# Patient Record
Sex: Male | Born: 2000 | Race: White | Hispanic: No | Marital: Single | Smoking: Current every day smoker
Health system: Southern US, Community
[De-identification: ages and names within clinical notes are randomized; demographics above are authoritative.]

## PROBLEM LIST (undated history)

## (undated) DIAGNOSIS — H6690 Otitis media, unspecified, unspecified ear: Secondary | ICD-10-CM

## (undated) DIAGNOSIS — R109 Unspecified abdominal pain: Secondary | ICD-10-CM

## (undated) DIAGNOSIS — F419 Anxiety disorder, unspecified: Secondary | ICD-10-CM

## (undated) DIAGNOSIS — R519 Headache, unspecified: Secondary | ICD-10-CM

## (undated) DIAGNOSIS — F909 Attention-deficit hyperactivity disorder, unspecified type: Secondary | ICD-10-CM

## (undated) DIAGNOSIS — R197 Diarrhea, unspecified: Secondary | ICD-10-CM

## (undated) DIAGNOSIS — R51 Headache: Secondary | ICD-10-CM

## (undated) HISTORY — DX: Headache: R51

## (undated) HISTORY — DX: Headache, unspecified: R51.9

## (undated) HISTORY — DX: Anxiety disorder, unspecified: F41.9

## (undated) HISTORY — DX: Diarrhea, unspecified: R19.7

## (undated) HISTORY — DX: Unspecified abdominal pain: R10.9

## (undated) HISTORY — DX: Otitis media, unspecified, unspecified ear: H66.90

## (undated) HISTORY — DX: Attention-deficit hyperactivity disorder, unspecified type: F90.9

---
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
## 2018-01-02 DIAGNOSIS — Z79899 Other long term (current) drug therapy: Secondary | ICD-10-CM | POA: Diagnosis not present

## 2018-01-07 DIAGNOSIS — L237 Allergic contact dermatitis due to plants, except food: Secondary | ICD-10-CM | POA: Diagnosis not present

## 2018-01-10 DIAGNOSIS — L258 Unspecified contact dermatitis due to other agents: Secondary | ICD-10-CM | POA: Diagnosis not present

## 2018-01-14 DIAGNOSIS — R21 Rash and other nonspecific skin eruption: Secondary | ICD-10-CM | POA: Diagnosis not present

## 2018-01-16 ENCOUNTER — Encounter: Payer: Self-pay | Admitting: Physician Assistant

## 2018-01-16 ENCOUNTER — Ambulatory Visit (INDEPENDENT_AMBULATORY_CARE_PROVIDER_SITE_OTHER): Payer: 59 | Admitting: Physician Assistant

## 2018-01-16 ENCOUNTER — Other Ambulatory Visit: Payer: Self-pay

## 2018-01-16 VITALS — BP 122/78 | HR 91 | Temp 98.6°F | Resp 17 | Ht 73.5 in | Wt 146.8 lb

## 2018-01-16 DIAGNOSIS — Z0000 Encounter for general adult medical examination without abnormal findings: Secondary | ICD-10-CM | POA: Insufficient documentation

## 2018-01-16 DIAGNOSIS — F4010 Social phobia, unspecified: Secondary | ICD-10-CM | POA: Diagnosis not present

## 2018-01-16 NOTE — Assessment & Plan Note (Addendum)
Depression screen negative. Health Maintenance reviewed -- Pediatric Immunizations up-to-date. TDaP up-to-date. Due for booster in 2021. Declines flu shot today. Patient is sexually active with male partners, notes consistent use of condoms.  Declines routine STI screen. Will start routine lipid checks at age 17.  Dietary and exercise recommendations given today. Preventive schedule discussed and handout given in AVS.

## 2018-01-16 NOTE — Patient Instructions (Signed)
Please continue care for rash as directed by the Urgent Care. Symptoms should continue to resolve. We will follow-up yearly for physicals and when you need me for sick visits and acute concerns. Please follow-up with your specialist as scheduled.   Welcome to [facility]!   Preventive Care 18-39 Years, Male Preventive care refers to lifestyle choices and visits with your health care provider that can promote health and wellness. What does preventive care include?  A yearly physical exam. This is also called an annual well check.  Dental exams once or twice a year.  Routine eye exams. Ask your health care provider how often you should have your eyes checked.  Personal lifestyle choices, including: ? Daily care of your teeth and gums. ? Regular physical activity. ? Eating a healthy diet. ? Avoiding tobacco and drug use. ? Limiting alcohol use. ? Practicing safe sex. What happens during an annual well check? The services and screenings done by your health care provider during your annual well check will depend on your age, overall health, lifestyle risk factors, and family history of disease. Counseling Your health care provider may ask you questions about your:  Alcohol use.  Tobacco use.  Drug use.  Emotional well-being.  Home and relationship well-being.  Sexual activity.  Eating habits.  Work and work Statistician.  Screening You may have the following tests or measurements:  Height, weight, and BMI.  Blood pressure.  Lipid and cholesterol levels. These may be checked every 5 years starting at age 32.  Diabetes screening. This is done by checking your blood sugar (glucose) after you have not eaten for a while (fasting).  Skin check.  Hepatitis C blood test.  Hepatitis B blood test.  Sexually transmitted disease (STD) testing.  Discuss your test results, treatment options, and if necessary, the need for more tests with your health care  provider. Vaccines Your health care provider may recommend certain vaccines, such as:  Influenza vaccine. This is recommended every year.  Tetanus, diphtheria, and acellular pertussis (Tdap, Td) vaccine. You may need a Td booster every 10 years.  Varicella vaccine. You may need this if you have not been vaccinated.  HPV vaccine. If you are 35 or younger, you may need three doses over 6 months.  Measles, mumps, and rubella (MMR) vaccine. You may need at least one dose of MMR.You may also need a second dose.  Pneumococcal 13-valent conjugate (PCV13) vaccine. You may need this if you have certain conditions and have not been vaccinated.  Pneumococcal polysaccharide (PPSV23) vaccine. You may need one or two doses if you smoke cigarettes or if you have certain conditions.  Meningococcal vaccine. One dose is recommended if you are age 62-21 years and a first-year college student living in a residence hall, or if you have one of several medical conditions. You may also need additional booster doses.  Hepatitis A vaccine. You may need this if you have certain conditions or if you travel or work in places where you may be exposed to hepatitis A.  Hepatitis B vaccine. You may need this if you have certain conditions or if you travel or work in places where you may be exposed to hepatitis B.  Haemophilus influenzae type b (Hib) vaccine. You may need this if you have certain risk factors.  Talk to your health care provider about which screenings and vaccines you need and how often you need them. This information is not intended to replace advice given to you by your health  care provider. Make sure you discuss any questions you have with your health care provider. Document Released: 06/18/2001 Document Revised: 01/10/2016 Document Reviewed: 02/21/2015 Elsevier Interactive Patient Education  [employer].

## 2018-01-16 NOTE — Assessment & Plan Note (Signed)
[facility]. Doing very well on current regimen of Cymbalta 60 mg daily. Continue current regimen.
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
## 2018-01-22 ENCOUNTER — Other Ambulatory Visit: Payer: Self-pay

## 2018-01-22 ENCOUNTER — Ambulatory Visit: Payer: Self-pay | Admitting: *Deleted

## 2018-01-22 ENCOUNTER — Encounter: Payer: Self-pay | Admitting: Family Medicine

## 2018-01-22 ENCOUNTER — Ambulatory Visit: Payer: 59 | Admitting: Family Medicine

## 2018-01-22 VITALS — BP 114/80 | HR 79 | Temp 98.2°F | Ht 73.5 in | Wt 149.2 lb

## 2018-01-22 DIAGNOSIS — I861 Scrotal varices: Secondary | ICD-10-CM | POA: Diagnosis not present

## 2018-01-22 DIAGNOSIS — Z91030 Bee allergy status: Secondary | ICD-10-CM | POA: Diagnosis not present

## 2018-01-22 MED ORDER — EPINEPHRINE 0.3 MG/0.3ML IJ SOAJ: 0.3000 mg | Freq: Once | INTRAMUSCULAR | 0 refills | Status: AC | PRN

## 2018-01-22 NOTE — Patient Instructions (Signed)
Please follow up if symptoms do not improve or as needed.  You may have a varicocele. This is benign. If it is not bothering you, you do not need treatment.  Please recheck yourself in 3-4 weeks. Return for reevaluation IF you have pain or if it is enlarging or changing. We would do an ultrasound if needed at that time.   Varicocele A varicocele is a swelling of veins in the scrotum. The scrotum is the sac that contains the testicles. Varicoceles can occur on either side of the scrotum, but they are more common on the left side. They occur most often in teenage boys and young men. In most cases, varicoceles are not a serious problem. They are usually small and painless and do not require treatment. Tests may be done to confirm the diagnosis. Treatment may be needed if:  A varicocele is large, causes a lot of pain, or causes pain when exercising.  Varicoceles are found on both sides of the scrotum.  The testicle on the opposite side is absent or not normal.  A varicocele causes a decrease in the size of the testicle in a growing adolescent.  The person has fertility problems.  What are the causes? This condition is the result of valves in the veins not working properly. Valves in the veins help to return blood from the scrotum and testicles to the heart. If these valves do not work well, blood flows backward and backs up into the veins, which causes the veins to swell. This is similar to what happens when varicose veins form in the leg. What are the signs or symptoms? Most varicoceles do not cause any symptoms. If symptoms do occur, they may include:  Swelling on one side of the scrotum. The swelling may be more obvious when you are standing up.  A lumpy feeling in the scrotum.  A heavy feeling on one side of the scrotum.  A dull ache in the scrotum, especially after exercise or prolonged standing or sitting.  Slower growth or reduced size of the testicle on the side of the varicocele  (in young males).  Problems with fertility. These can occur if the testicle does not grow normally.  How is this diagnosed? This condition may be diagnosed with a physical exam. You may also have an imaging test, called an ultrasound, to confirm the diagnosis and to help rule out other causes of the swelling. How is this treated? Treatment is usually not needed for this condition. If you have any pain, your health care provider may prescribe or recommend medicine to help relieve it. You may need regular exams so your health care provider can monitor the varicocele to ensure that it does not cause problems. When further treatment is needed, it may involve one of these options:  Varicocelectomy. This is a surgery in which the swollen veins are tied off so that the flow of blood goes to other veins instead.  Embolization. In this procedure, a small tube (catheter) is used to place metal coils or other blocking items in the veins. This cuts off the blood flow to the swollen veins.  Follow these instructions at home:  Take medicines only as directed by your health care provider.  Wear supportive underwear.  Use an athletic supporter for sports.  Keep all follow-up visits as directed by your health care provider. This is important. Contact a health care provider if:  Your pain is increasing.  You have redness in the affected area.  You  have swelling that does not decrease when you are lying down.  One of your testicles is smaller than the other.  Your testicle becomes enlarged, swollen, or painful. This information is not intended to replace advice given to you by your health care provider. Make sure you discuss any questions you have with your health care provider. Document Released: 07/29/2000 Document Revised: 10/04/2015 Document Reviewed: 03/30/2014 Elsevier Interactive Patient Education  [employer].

## 2018-01-22 NOTE — Progress Notes (Signed)
Subjective  CC:  Chief Complaint  Patient presents with  . Testicle Pain    Patient states that he found lump in the Left testicle x 2 days     HPI: Mark Monroe is a 17 y.o. male who presents to the office today to address the problems listed above in the chief complaint.  Healthy 17 year old male not currently sexually active but has been in the past without history of STDs presents because he is worried about a left testicular mass.  He reports he noted a lump above his left testicle 2 days ago while showering.  He is had no symptoms.  No penile discharge, pain, rash.  Normal urination.  Reports he is a hypochondriac and would like this checked.  Assessment  1. Left varicocele   2. Bee sting allergy      Plan   Left scrotal mass: Findings most consistent with left varicocele.  Education given on diagnosis and prognosis.  See after visit summary for further education.  Weight and monitor for now.  Return for further evaluation if changing, growing, becoming tender.  Patient understands and agrees with care plan  History of bee sting allergy and anaphylaxis.  EpiPen ordered.  Patient declined flu shot  Follow up: Return if symptoms worsen or fail to improve.   No orders of the defined types were placed in this encounter.  Meds ordered this encounter  Medications  . EPINEPHrine (EPIPEN 2-PAK) 0.3 mg/0.3 mL IJ SOAJ injection    Sig: Inject 0.3 mLs (0.3 mg total) into the muscle once as needed (for severe allergic reaction). CAll 911asap if you have to use this medicine    Dispense:  62 Device    Refill:  0      I reviewed the patients updated PMH, FH, and SocHx.    Patient Active Problem List   Diagnosis Date Noted  . Visit for preventive health examination 01/16/2018  . Social anxiety disorder 12/26/2014  . ADHD (attention deficit hyperactivity disorder) 01/24/2011   Current Meds  Medication Sig  . ciclopirox (LOPROX) 0.77 % cream USE ON AFFECTED NAIL AREA TWICE  A DAY  . DULoxetine (CYMBALTA) 60 MG capsule TAKE 1 CAPSULE BY MOUTH EVERY DAY. START ONCE COMPLETED 30MG  TITRATION RX  . EPINEPHrine (EPIPEN 2-PAK) 0.3 mg/0.3 mL IJ SOAJ injection Inject 0.3 mLs (0.3 mg total) into the muscle once as needed (for severe allergic reaction). CAll 911asap if you have to use this medicine  . mupirocin ointment (BACTROBAN) 2 % mupirocin 2 % topical ointment  APPLY TWICE A DAY TO AFFECTED AREAS  . [DISCONTINUED] EPINEPHrine (EPIPEN 2-PAK) 0.3 mg/0.3 mL SOAJ injection Inject 0.3 mLs (0.3 mg total) into the muscle once as needed (for severe allergic reaction). CAll 911asap if you have to use this medicine    Allergies: Patient is allergic to wasp venom. Family History: Patient family history includes ADD / ADHD in his cousin; Anxiety disorder in his mother; Cancer in his paternal grandfather and paternal grandmother; Cholecystitis in his father; Depression in his maternal grandmother and mother; Heart disease in his maternal grandfather, paternal grandfather, and paternal grandmother; Hereditary spherocytosis in his cousin; Hyperlipidemia in his father, maternal grandfather, paternal grandfather, and paternal grandmother; Hypertension in his father and maternal grandmother; Irritable bowel syndrome in his mother. Social History:  Patient  reports that he has been smoking e-cigarettes. His smokeless tobacco use includes snuff. He reports that he has current or past drug history. Drug: Marijuana. He reports that  he does not drink alcohol.  Review of Systems: Constitutional: Negative for fever malaise or anorexia Cardiovascular: negative for chest pain Respiratory: negative for SOB or persistent cough Gastrointestinal: negative for abdominal pain  Objective  Vitals: BP 114/80   Pulse 79   Temp 98.2 F (36.8 C)   Ht 6' 1.5" (1.867 m)   Wt 149 lb 3.2 oz (67.7 kg)   SpO2 98%   BMI 19.42 kg/m  General: no acute distress , A&Ox3 Anxious appearing Scrotal exam: No  rash, normal appearing, right testicle normal and nontender without mass, left testicle normal, superior to testicle is about 1 cm mobile nontender mass associated with surrounding thickness.   Commons side effects, risks, benefits, and alternatives for medications and treatment plan prescribed today were discussed, and the patient expressed understanding of the given instructions. Patient is instructed to call or message via MyChart if he/she has any questions or concerns regarding our treatment plan. No barriers to understanding were identified. We discussed Red Flag symptoms and signs in detail. Patient expressed understanding regarding what to do in case of urgent or emergency type symptoms.   Medication list was reconciled, printed and provided to the patient in AVS. Patient instructions and summary information was reviewed with the patient as documented in the AVS. This note was prepared with assistance of Dragon voice recognition software. Occasional wrong-word or sound-a-like substitutions may have occurred due to the inherent limitations of voice recognition software

## 2018-01-22 NOTE — Telephone Encounter (Addendum)
Pt called with concerns because he felt a small lump above his left testicle; he noticed it on Tuesday 01/20/18 the pt says he was on predisone last week; recommendations made per nurse triage protocol to include seeing a physician within 3 days; he requests to be seen in office today; pt offered and accepted appointment with Dr Temple Paciniamille  Andly, [facility], 01/22/18 at 1600; he vebalizes undersrtanding; will route to office for notification of this upcoming appointment   Reason for Disposition . All other penis - scrotum symptoms  (Exception: painless rash < 24 hours duration)  Answer Assessment - Initial Assessment Questions 1. SYMPTOM: "What's the main symptom you're concerned about?" (e.g., discharge from penis, rash, pain, itching, swelling)     Lump on left testicle  2. LOCATION: "Where is the  located?"     Above left testicle 3. ONSET: "When did   start?"  01/20/18 4. PAIN: "Is there any pain?" If so, ask: "How bad is it?"  (Scale 1-10; or mild, moderate, severe)     no 5. URINE: "Any difficulty passing urine?" If so, ask: "When was the last time?"     no 6. CAUSE: "What do you think is causing the symptoms?"     Not sure 7. OTHER SYMPTOMS: "Do you have any other symptoms?" (e.g., fever, abdominal pain, blood in urine)     no  Protocols used: PENIS AND [facility]CROTUM [facility]

## 2018-01-23 ENCOUNTER — Encounter: Payer: Self-pay | Admitting: Physician Assistant

## 2018-01-23 ENCOUNTER — Telehealth: Payer: Self-pay | Admitting: Physician Assistant

## 2018-01-23 ENCOUNTER — Ambulatory Visit: Payer: 59 | Admitting: Physician Assistant

## 2018-01-23 VITALS — BP 100/60 | HR 99 | Temp 98.4°F | Resp 14 | Ht 73.5 in | Wt 147.0 lb

## 2018-01-23 DIAGNOSIS — I861 Scrotal varices: Secondary | ICD-10-CM

## 2018-01-23 DIAGNOSIS — N50819 Testicular pain, unspecified: Secondary | ICD-10-CM | POA: Diagnosis not present

## 2018-01-23 NOTE — Progress Notes (Signed)
Patient presents to clinic today c/o left testicular soreness/aching x 3 days. Was seen yesterday for this concern by Dr. Asencion Partridgeamille Andy, at which time he was diagnosed with a varicocele. No imaging performed at that time. Reassurance was given. Notes now he has more of a consistent ache. Does a lot of heavy lifting at work. Denies urinary urgency, frequency, penile pain or drainage. Is not currently sexually active. Denies concern for STI.   Past Medical History:  Diagnosis Date  . Abdominal pain   . ADHD (attention deficit hyperactivity disorder)    Eval by Dr. Kem KaysKuhn at 6/13[birth year]15 years of age  . Anxiety   . Diarrhea   . Frequent headaches   . Neonatal hyperbilirubinemia 07/1998   peak bili 19.0, Rx phototherapy for several days  . Otitis media     Current Outpatient Medications on File Prior to Visit  Medication Sig Dispense Refill  . ciclopirox (LOPROX) 0.77 % cream USE ON AFFECTED NAIL AREA TWICE A DAY  0  . DULoxetine (CYMBALTA) 60 MG capsule TAKE 1 CAPSULE BY MOUTH EVERY DAY. START ONCE COMPLETED 30MG  TITRATION RX  4  . EPINEPHrine (EPIPEN 2-PAK) 0.3 mg/0.3 mL IJ SOAJ injection Inject 0.3 mLs (0.3 mg total) into the muscle once as needed (for severe allergic reaction). CAll 911asap if you have to use this medicine 62 Device 0  . mupirocin ointment (BACTROBAN) 2 % mupirocin 2 % topical ointment  APPLY TWICE A DAY TO AFFECTED AREAS    . [DISCONTINUED] GuanFACINE HCl 3 MG TB24 Take 1 tablet (3 mg total) by mouth daily. 30 tablet 1   No current facility-administered medications on file prior to visit.     Allergies  Allergen Reactions  . Wasp Venom Hives, Itching and Swelling    Developed swelling of soft tissues in L hand and tongue, systemic itching, hives, and rash after being stung    Family History  Problem Relation Age of Onset  . Cancer Paternal Grandfather   . Heart disease Paternal Grandfather   . Hyperlipidemia Paternal Grandfather   . Irritable bowel syndrome Mother     . Anxiety disorder Mother   . Depression Mother   . Cholecystitis Father   . Hypertension Father   . Hyperlipidemia Father   . Depression Maternal Grandmother   . Hypertension Maternal Grandmother   . Heart disease Paternal Grandmother   . Hyperlipidemia Paternal Grandmother   . Cancer Paternal Grandmother   . Hereditary spherocytosis Cousin   . ADD / ADHD Cousin   . Heart disease Maternal Grandfather   . Hyperlipidemia Maternal Grandfather   . Crohn's disease Neg Hx   . Ulcerative colitis Neg Hx   . Celiac disease Neg Hx   . Ulcers Neg Hx     Social History   Socioeconomic History  . Marital status: Single    Spouse name: Not on file  . Number of children: Not on file  . Years of education: Not on file  . Highest education level: Not on file  Occupational History  . Not on file  Social Needs  . Financial resource strain: Not on file  . Food insecurity:    Worry: Not on file    Inability: Not on file  . Transportation needs:    Medical: Not on file    Non-medical: Not on file  Tobacco Use  . Smoking status: Current Every Day Smoker    Types: E-cigarettes  . Smokeless tobacco: Current User    Types:  Snuff  Substance and Sexual Activity  . Alcohol use: No  . Drug use: Yes    Types: Marijuana  . Sexual activity: Yes    Birth control/protection: Condom  Lifestyle  . Physical activity:    Days per week: Not on file    Minutes per session: Not on file  . Stress: Not on file  Relationships  . Social connections:    Talks on phone: Not on file    Gets together: Not on file    Attends religious service: Not on file    Active member of club or organization: Not on file    Attends meetings of clubs or organizations: Not on file    Relationship status: Not on file  Other Topics Concern  . Not on file  Social History Narrative  . Not on file   Review of Systems - See HPI.  All other ROS are negative.  BP 100/60   Pulse 99   Temp 98.4 F (36.9 C) (Oral)    Resp 14   Ht 6' 1.5" (1.867 m)   Wt 147 lb (66.7 kg)   SpO2 99%   BMI 19.13 kg/m   Physical Exam  Constitutional: He appears well-developed and well-nourished.  HENT:  Head: Normocephalic and atraumatic.  Cardiovascular: Normal rate, regular rhythm, normal heart sounds and intact distal pulses.  Abdominal: Hernia confirmed negative in the left inguinal area.  Genitourinary: Penis normal. Left testis shows swelling (dilation of pampiniform plexus noted on examination). Left testis shows no tenderness. Circumcised.  Lymphadenopathy: No inguinal adenopathy noted on the left side.  Vitals reviewed.  Assessment/Plan: 1. Left varicocele 2. Testicular pain No pain on examination. Suspect aching is from the varicocele. Will get [place] to confirm and rule out other abnormalities. Recommended supportive undergarments, avoidance of heavy lifting and Aleve. Er for any acute worsening of symptoms before workup is complete.  - US Scrotum; Future - [place] Art/Ven Flow Abd Pelv Doppler; Future   Piedad Climes, PA-C

## 2018-01-23 NOTE — Patient Instructions (Signed)
Please wear supportive undergarments.  Limit heavy lifting and overexertion. Start an Aleve twice daily to help with discomfort.   Please stop by the front desk to speak with Marylene LandAngela about scheduling your US. I will call with results as soon as we have them.  ER if you note any acute worsening of pain.

## 2018-01-23 NOTE — Telephone Encounter (Signed)
Copied from CRM [phone]. Topic: Quick Communication - See Telephone Encounter >> Jan 23, 2018  2:09 PM Lorrine KinMcGee, Armya Westerhoff B, NT wrote: CRM for notification. See Telephone encounter for: 01/23/18. Patient calling and would like a phone call from Dr Mardelle MatteAndy regarding his visit yesterday for lump on testicle. States that he is still having pain. Would like a call to discuss this with Dr Mardelle MatteAndy. CB#: [phone]

## 2018-01-23 NOTE — Telephone Encounter (Signed)
Called Patient and he states that he has an appt with Malva Coganody Martin today, he states that his left testicle is painful and aches. I advised Malva Coganody Martin, PA-C, of complaints.   Kathi SimpersAmy Phala Schraeder,  LPN

## 2018-01-26 ENCOUNTER — Other Ambulatory Visit: Payer: 59

## 2018-01-27 ENCOUNTER — Ambulatory Visit
Admission: RE | Admit: 2018-01-27 | Discharge: 2018-01-27 | Disposition: A | Discharge status: Home / Self Care | Payer: 59 | Source: Ambulatory Visit | Attending: Physician Assistant | Admitting: Physician Assistant

## 2018-01-27 DIAGNOSIS — I860 Sublingual varices: Secondary | ICD-10-CM | POA: Diagnosis not present

## 2018-01-27 DIAGNOSIS — N50819 Testicular pain, unspecified: Secondary | ICD-10-CM

## 2018-01-27 DIAGNOSIS — I861 Scrotal varices: Secondary | ICD-10-CM

## 2018-01-28 ENCOUNTER — Other Ambulatory Visit: Payer: Self-pay | Admitting: Physician Assistant

## 2018-01-28 ENCOUNTER — Encounter: Payer: Self-pay | Admitting: Physician Assistant

## 2018-01-28 DIAGNOSIS — L237 Allergic contact dermatitis due to plants, except food: Secondary | ICD-10-CM | POA: Diagnosis not present

## 2018-01-28 DIAGNOSIS — I861 Scrotal varices: Secondary | ICD-10-CM

## 2018-03-13 ENCOUNTER — Other Ambulatory Visit: Payer: Self-pay

## 2018-03-13 ENCOUNTER — Encounter: Payer: Self-pay | Admitting: Physician Assistant

## 2018-03-13 ENCOUNTER — Ambulatory Visit: Payer: 59 | Admitting: Physician Assistant

## 2018-03-13 VITALS — BP 102/60 | HR 86 | Temp 98.4°F | Resp 14 | Ht 73.5 in | Wt 150.0 lb

## 2018-03-13 DIAGNOSIS — M545 Low back pain, unspecified: Secondary | ICD-10-CM

## 2018-03-13 DIAGNOSIS — I861 Scrotal varices: Secondary | ICD-10-CM | POA: Diagnosis not present

## 2018-03-13 MED ORDER — CYCLOBENZAPRINE HCL 10 MG PO TABS: 10.0000 mg | ORAL_TABLET | Freq: Three times a day (TID) | ORAL | 0 refills | Status: DC | PRN

## 2018-03-13 MED ORDER — MELOXICAM 15 MG PO TABS: 15.0000 mg | ORAL_TABLET | Freq: Every day | ORAL | 0 refills | Status: DC

## 2018-03-13 NOTE — Progress Notes (Signed)
Patient presents to clinic today c/o left-sided low back pain over the past couple of days. Notes pain is sometimes only present with ROM but occasionally will have a sharp pain and feeling of tension. This eases up after a few minutes. Patient does heavy lifting at work on a regular basis but denies any noted trauma. Feels he likely sprained it sometime but cannot remember a specific incident. Has taken some Ibuprofen with mild relief of symptoms. Patient also with history of left varicocele noted on prior exam and confirmed by Ultrasonography. Has been symptomatic for which he was referred to Urology. Has appt next week. Notes area seems to be throbbing more. Denies increased swelling or testicular pain itself. Denies change to urinary habits.  Past Medical History:  Diagnosis Date  . Abdominal pain   . ADHD (attention deficit hyperactivity disorder)    Eval by Dr. Kem Kays at [birth year]13[birth year]15 years of age  . Anxiety   . Diarrhea   . Frequent headaches   . Neonatal hyperbilirubinemia 07/1998   peak bili 19.0, Rx phototherapy for several days  . Otitis media     Current Outpatient Medications on File Prior to Visit  Medication Sig Dispense Refill  . DULoxetine (CYMBALTA) 60 MG capsule TAKE 1 CAPSULE BY MOUTH EVERY DAY. START ONCE COMPLETED 30MG  TITRATION RX  4  . EPINEPHrine (EPIPEN 2-PAK) 0.3 mg/0.3 mL IJ SOAJ injection Inject 0.3 mLs (0.3 mg total) into the muscle once as needed (for severe allergic reaction). CAll 911asap if you have to use this medicine 62 Device 0  . [DISCONTINUED] GuanFACINE HCl 3 MG TB24 Take 1 tablet (3 mg total) by mouth daily. 30 tablet 1   No current facility-administered medications on file prior to visit.     Allergies  Allergen Reactions  . Wasp Venom Hives, Itching and Swelling    Developed swelling of soft tissues in L hand and tongue, systemic itching, hives, and rash after being stung    Family History  Problem Relation Age of Onset  . Cancer Paternal  Grandfather   . Heart disease Paternal Grandfather   . Hyperlipidemia Paternal Grandfather   . Irritable bowel syndrome Mother   . Anxiety disorder Mother   . Depression Mother   . Cholecystitis Father   . Hypertension Father   . Hyperlipidemia Father   . Depression Maternal Grandmother   . Hypertension Maternal Grandmother   . Heart disease Paternal Grandmother   . Hyperlipidemia Paternal Grandmother   . Cancer Paternal Grandmother   . Hereditary spherocytosis Cousin   . ADD / ADHD Cousin   . Heart disease Maternal Grandfather   . Hyperlipidemia Maternal Grandfather   . Crohn's disease Neg Hx   . Ulcerative colitis Neg Hx   . Celiac disease Neg Hx   . Ulcers Neg Hx     Social History   Socioeconomic History  . Marital status: Single    Spouse name: Not on file  . Number of children: Not on file  . Years of education: Not on file  . Highest education level: Not on file  Occupational History  . Not on file  Social Needs  . Financial resource strain: Not on file  . Food insecurity:    Worry: Not on file    Inability: Not on file  . Transportation needs:    Medical: Not on file    Non-medical: Not on file  Tobacco Use  . Smoking status: Current Every Day Smoker    Types:  E-cigarettes  . Smokeless tobacco: Current User    Types: Snuff  Substance and Sexual Activity  . Alcohol use: No  . Drug use: Yes    Types: Marijuana  . Sexual activity: Yes    Birth control/protection: Condom  Lifestyle  . Physical activity:    Days per week: Not on file    Minutes per session: Not on file  . Stress: Not on file  Relationships  . Social connections:    Talks on phone: Not on file    Gets together: Not on file    Attends religious service: Not on file    Active member of club or organization: Not on file    Attends meetings of clubs or organizations: Not on file    Relationship status: Not on file  Other Topics Concern  . Not on file  Social History Narrative  . Not  on file   Review of Systems - See HPI.  All other ROS are negative.  BP 102/60   Pulse 86   Temp 98.4 F (36.9 C) (Oral)   Resp 14   Ht 6' 1.5" (1.867 m)   Wt 150 lb (68 kg)   SpO2 99%   BMI 19.52 kg/m   Physical Exam  Constitutional: He is oriented to person, place, and time. He appears well-developed and well-nourished.  Eyes: Conjunctivae are normal.  Cardiovascular: Normal rate, regular rhythm, normal heart sounds and intact distal pulses.  Pulmonary/Chest: Effort normal and breath sounds normal. No stridor. No respiratory distress. He has no wheezes. He has no rales. He exhibits no tenderness.  Genitourinary:     Musculoskeletal:       Lumbar back: He exhibits tenderness (left-sided perispinal musculature) and spasm.  Neurological: He is alert and oriented to person, place, and time.  Vitals reviewed.  Assessment/Plan: 1. Varicocele Unchanged on examination. Has appt with Urology next week for further assessment and management. Rx Meloxicam to use once daily with food. Tylenol for breakthrough pain.  2. Acute left-sided low back pain without sciatica Start Meloxicam 15 mg once daily with food. Rx Flexeril for evening use. Supportive measures reviewed. Follow-up if not resolving. - meloxicam (MOBIC) 15 MG tablet; Take 1 tablet (15 mg total) by mouth daily.  Dispense: 15 tablet; Refill: 0 - cyclobenzaprine (FLEXERIL) 10 MG tablet; Take 1 tablet (10 mg total) by mouth 3 (three) times daily as needed for muscle spasms.  Dispense: 30 tablet; Refill: 0   Piedad Climes, PA-C

## 2018-03-13 NOTE — Patient Instructions (Signed)
Please avoid heavy lifting. Take the Meloxicam once daily with food over the next week. Tylenol for breakthrough pain. Do not take more Ibuprofen with this. Take the Flexeril in the evening to help with muscle tension.  Follow-up with Urologist next week as scheduled.

## 2018-03-20 DIAGNOSIS — I861 Scrotal varices: Secondary | ICD-10-CM | POA: Diagnosis not present

## 2018-03-20 DIAGNOSIS — R102 Pelvic and perineal pain: Secondary | ICD-10-CM | POA: Diagnosis not present

## 2019-03-16 ENCOUNTER — Encounter: Payer: Self-pay | Admitting: Physician Assistant

## 2019-03-16 ENCOUNTER — Ambulatory Visit (INDEPENDENT_AMBULATORY_CARE_PROVIDER_SITE_OTHER): Payer: 59 | Admitting: Physician Assistant

## 2019-03-16 ENCOUNTER — Other Ambulatory Visit: Payer: Self-pay

## 2019-03-16 VITALS — BP 112/70 | HR 76 | Temp 98.1°F | Resp 14 | Ht 73.5 in | Wt 150.0 lb

## 2019-03-16 DIAGNOSIS — H9313 Tinnitus, bilateral: Secondary | ICD-10-CM | POA: Diagnosis not present

## 2019-03-16 DIAGNOSIS — Z0000 Encounter for general adult medical examination without abnormal findings: Secondary | ICD-10-CM

## 2019-03-16 DIAGNOSIS — F17200 Nicotine dependence, unspecified, uncomplicated: Secondary | ICD-10-CM | POA: Diagnosis not present

## 2019-03-16 LAB — LIPID PANEL
Cholesterol: 133 mg/dL (ref 0–200)
HDL: 45.4 mg/dL (ref >39.00)
LDL Cholesterol: 71 mg/dL (ref 0–99)
NonHDL: 87.19
Total CHOL/HDL Ratio: 3
Triglycerides: 80 mg/dL (ref 0.0–149.0)
VLDL: 16 mg/dL (ref 0.0–40.0)

## 2019-03-16 LAB — COMPREHENSIVE METABOLIC PANEL
ALT: 10 U/L (ref 0–53)
AST: 15 U/L (ref 0–37)
Albumin: 4.9 g/dL (ref 3.5–5.2)
Alkaline Phosphatase: 90 U/L (ref 39–117)
BUN: 7 mg/dL (ref 6–23)
CO2: 28 mEq/L (ref 19–32)
Calcium: 9.9 mg/dL (ref 8.4–10.5)
Chloride: 102 mEq/L (ref 96–112)
Creatinine, Ser: 0.86 mg/dL (ref 0.40–1.50)
GFR: 112.59 mL/min (ref >60.00)
Glucose, Bld: 93 mg/dL (ref 70–99)
Potassium: 4.4 mEq/L (ref 3.5–5.1)
Sodium: 138 mEq/L (ref 135–145)
Total Bilirubin: 0.4 mg/dL (ref 0.2–1.2)
Total Protein: 7.6 g/dL (ref 6.0–8.3)

## 2019-03-16 NOTE — Progress Notes (Signed)
Patient presents to clinic today for annual exam.  Patient is fasting for labs.  Acute Concerns: Chest tightness-- thoroughout chest, intermittent, no precipitating factors. Endorses vaping since age 18, 1 per day. States he has cut back. Denies palpitations, pain radiating to back, jaw, or arms, dizziness, lightheadedness. Has tried to quit vaping multiple times.    Chronic Issues: ADHD-- no difficulty focusing or concentrating. No changes in mood, denies irritability, anhedonia.    Health Maintenance: Immunizations -- patient declined today   Past Medical History:  Diagnosis Date  . Abdominal pain   . ADHD (attention deficit hyperactivity disorder)    Eval by Dr. Orma Render at [birth year]14[birth year]16 years of age  . Anxiety   . Diarrhea   . Frequent headaches   . Neonatal hyperbilirubinemia 1998/06/22   peak bili 19.0, Rx phototherapy for several days  . Otitis media     Past Surgical History:  Procedure Laterality Date  . NASOLACRIMAL DUCT PROBING  09/1999   Surgery at [facility] by Dr. Everitt Amber  . NASOLACRIMAL DUCT PROBING W/ INSERTION OF STENT  02/2000   [facility]gery at [facility] after recurrence after probing  alone in 09/1999    Current Outpatient Medications on File Prior to Visit  Medication Sig Dispense Refill  . EPINEPHrine (EPIPEN 2-PAK) 0.3 mg/0.3 mL IJ SOAJ injection Inject 0.3 mLs (0.3 mg total) into the muscle once as needed (for severe allergic reaction). CAll 911asap if you have to use this medicine 62 Device 0  . DULoxetine (CYMBALTA) 60 MG capsule TAKE 1 CAPSULE BY MOUTH EVERY DAY. START ONCE COMPLETED 30MG TITRATION RX  4  . [DISCONTINUED] GuanFACINE HCl 3 MG TB24 Take 1 tablet (3 mg total) by mouth daily. 30 tablet 1   No current facility-administered medications on file prior to visit.     Allergies  Allergen Reactions  . Wasp Venom Hives, Itching and Swelling    Developed swelling of soft tissues in L hand and tongue, systemic itching, hives, and rash after being stung     Family History  Problem Relation Age of Onset  . Cancer Paternal Grandfather   . Heart disease Paternal Grandfather   . Hyperlipidemia Paternal Grandfather   . Irritable bowel syndrome Mother   . Anxiety disorder Mother   . Depression Mother   . Cholecystitis Father   . Hypertension Father   . Hyperlipidemia Father   . Depression Maternal Grandmother   . Hypertension Maternal Grandmother   . Heart disease Paternal Grandmother   . Hyperlipidemia Paternal Grandmother   . Cancer Paternal Grandmother   . Hereditary spherocytosis Cousin   . ADD / ADHD Cousin   . Heart disease Maternal Grandfather   . Hyperlipidemia Maternal Grandfather   . Crohn's disease Neg Hx   . Ulcerative colitis Neg Hx   . Celiac disease Neg Hx   . Ulcers Neg Hx     Social History   Socioeconomic History  . Marital status: Single    Spouse name: Not on file  . Number of children: Not on file  . Years of education: Not on file  . Highest education level: Not on file  Occupational History  . Not on file  Social Needs  . Financial resource strain: Not on file  . Food insecurity    Worry: Not on file    Inability: Not on file  . Transportation needs    Medical: Not on file    Non-medical: Not on file  Tobacco Use  .  Smoking status: Current Every Day Smoker    Types: E-cigarettes  . Smokeless tobacco: Current User    Types: Snuff  Substance and Sexual Activity  . Alcohol use: No  . Drug use: Yes    Types: Marijuana  . Sexual activity: Yes    Birth control/protection: Condom  Lifestyle  . Physical activity    Days per week: Not on file    Minutes per session: Not on file  . Stress: Not on file  Relationships  . Social Herbalist on phone: Not on file    Gets together: Not on file    Attends religious service: Not on file    Active member of club or organization: Not on file    Attends meetings of clubs or organizations: Not on file    Relationship status: Not on file  .  Intimate partner violence    Fear of current or ex partner: Not on file    Emotionally abused: Not on file    Physically abused: Not on file    Forced sexual activity: Not on file  Other Topics Concern  . Not on file  Social History Narrative  . Not on file   Review of Systems  Constitutional: Negative for fever.  HENT: Positive for tinnitus (intermittent). Negative for ear discharge, ear pain and hearing loss.   Eyes: Negative for blurred vision and double vision.  Respiratory: Negative for cough and shortness of breath.   Cardiovascular: Positive for chest pain (chest tightness ). Negative for palpitations.  Gastrointestinal: Negative for abdominal pain, blood in stool, constipation, diarrhea, heartburn, melena, nausea and vomiting.  Genitourinary: Negative for dysuria and frequency.  Neurological: Positive for headaches (several times per week). Negative for dizziness.  Psychiatric/Behavioral: Negative for depression and suicidal ideas. The patient is not nervous/anxious and does not have insomnia.    BP 112/70   Pulse 76   Temp 98.1 F (36.7 C) (Temporal)   Resp 14   Ht 6' 1.5" (1.867 m)   Wt 150 lb (68 kg)   SpO2 98%   BMI 19.52 kg/m   Physical Exam Constitutional:      Appearance: Normal appearance.  HENT:     Head: Normocephalic and atraumatic.     Right Ear: Tympanic membrane, ear canal and external ear normal.     Left Ear: Tympanic membrane, ear canal and external ear normal.  Eyes:     Conjunctiva/sclera: Conjunctivae normal.     Pupils: Pupils are equal, round, and reactive to light.  Neck:     Musculoskeletal: Neck supple.  Cardiovascular:     Rate and Rhythm: Normal rate and regular rhythm.     Heart sounds: Normal heart sounds.  Pulmonary:     Effort: Pulmonary effort is normal.     Breath sounds: Normal breath sounds. No wheezing.  Abdominal:     General: Abdomen is flat. Bowel sounds are normal.     Palpations: Abdomen is soft. There is no mass.      Tenderness: There is no abdominal tenderness. There is no guarding.  Lymphadenopathy:     Cervical: No cervical adenopathy.  Skin:    General: Skin is warm and dry.  Neurological:     General: No focal deficit present.     Mental Status: He is alert.  Psychiatric:        Mood and Affect: Mood normal.    Assessment/Plan: 1. Visit for preventive health examination Depression screen negative. Health Maintenance  reviewed. Preventive schedule discussed and handout given in AVS. Will obtain fasting labs today and notify patient of results.  - Lipid Profile - Comp Met (CMET)  2. Vaping nicotine dependence, non-tobacco product Patient interested in quitting, states he is ready. Discussed options including medication, nicotine replacement therapy, and behavioral therapy. Patient not interested in medication, would like to consider his options. Provided patient with smoking cessation handout and resources.   3. Tinnitus of both ears Intermittent, no associated with hearing loss or ear pain. Patient works in [employer], advised him to always wear ear plugs or noise cancelling headphones to protect his ears.     Leeanne Rio, PA-C

## 2019-03-16 NOTE — Patient Instructions (Addendum)
Please go to the lab for blood work.   Our office will call you with your results unless you have chosen to receive results via MyChart.  If your blood work is normal we will follow-up each year for physicals and as scheduled for chronic medical problems.  If anything is abnormal we will treat accordingly and get you in for a follow-up.  Please make sure to always wear ear plugs or noise cancelling headphones at work to protect the ears and your sense of hearing.   Please consider letting us help you stop vaping.  Please review the information below.    Steps to Quit Smoking Smoking tobacco is the leading cause of preventable death. It can affect almost every organ in the body. Smoking puts you and people around you at risk for many serious, long-lasting (chronic) diseases. Quitting smoking can be hard, but it is one of the best things that you can do for your health. It is never too late to quit. How do I get ready to quit? When you decide to quit smoking, make a plan to help you succeed. Before you quit:  Pick a date to quit. Set a date within the next 2 weeks to give you time to prepare.  Write down the reasons why you are quitting. Keep this list in places where you will see it often.  Tell your family, friends, and co-workers that you are quitting. Their support is important.  Talk with your doctor about the choices that may help you quit.  Find out if your health insurance will pay for these treatments.  Know the people, places, things, and activities that make you want to smoke (triggers). Avoid them. What first steps can I take to quit smoking?  Throw away all cigarettes at home, at work, and in your car.  Throw away the things that you use when you smoke, such as ashtrays and lighters.  Clean your car. Make sure to empty the ashtray.  Clean your home, including curtains and carpets. What can I do to help me quit smoking? Talk with your doctor about taking medicines  and seeing a counselor at the same time. You are more likely to succeed when you do both.  If you are pregnant or breastfeeding, talk with your doctor about counseling or other ways to quit smoking. Do not take medicine to help you quit smoking unless your doctor tells you to do so. To quit smoking: Quit right away  Quit smoking totally, instead of slowly cutting back on how much you smoke over a period of time.  Go to counseling. You are more likely to quit if you go to counseling sessions regularly. Take medicine You may take medicines to help you quit. Some medicines need a prescription, and some you can buy over-the-counter. Some medicines may contain a drug called nicotine to replace the nicotine in cigarettes. Medicines may:  Help you to stop having the desire to smoke (cravings).  Help to stop the problems that come when you stop smoking (withdrawal symptoms). Your doctor may ask you to use:  Nicotine patches, gum, or lozenges.  Nicotine inhalers or sprays.  Non-nicotine medicine that is taken by mouth. Find resources Find resources and other ways to help you quit smoking and remain smoke-free after you quit. These resources are most helpful when you use them often. They include:  Online chats with a Veterinary surgeon.  Phone quitlines.  Printed Materials engineer.  Support groups or group counseling.  Text messaging  programs.  Mobile phone apps. Use apps on your mobile phone or tablet that can help you stick to your quit plan. There are many free apps for mobile phones and tablets as well as websites. Examples include Quit Guide from the [employer] and smokefree.gov  What things can I do to make it easier to quit?   Talk to your family and friends. Ask them to support and encourage you.  Call a phone quitline (1-800-QUIT-NOW), reach out to support groups, or work with a Social worker.  Ask people who smoke to not smoke around you.  Avoid places that make you want to smoke, such  as: ? Bars. ? Parties. ? Smoke-break areas at work.  Spend time with people who do not smoke.  Lower the stress in your life. Stress can make you want to smoke. Try these things to help your stress: ? Getting regular exercise. ? Doing deep-breathing exercises. ? Doing yoga. ? Meditating. ? Doing a body scan. To do this, close your eyes, focus on one area of your body at a time from head to toe. Notice which parts of your body are tense. Try to relax the muscles in those areas. How will I feel when I quit smoking? Day 1 to 3 weeks Within the first 24 hours, you may start to have some problems that come from quitting tobacco. These problems are very bad 2-3 days after you quit, but they do not often last for more than 2-3 weeks. You may get these symptoms:  Mood swings.  Feeling restless, nervous, angry, or annoyed.  Trouble concentrating.  Dizziness.  Strong desire for high-sugar foods and nicotine.  Weight gain.  Trouble pooping (constipation).  Feeling like you may vomit (nausea).  Coughing or a sore throat.  Changes in how the medicines that you take for other issues work in your body.  Depression.  Trouble sleeping (insomnia). Week 3 and afterward After the first 2-3 weeks of quitting, you may start to notice more positive results, such as:  Better sense of smell and taste.  Less coughing and sore throat.  Slower heart rate.  Lower blood pressure.  Clearer skin.  Better breathing.  Fewer sick days. Quitting smoking can be hard. Do not give up if you fail the first time. Some people need to try a few times before they succeed. Do your best to stick to your quit plan, and talk with your doctor if you have any questions or concerns. Summary  Smoking tobacco is the leading cause of preventable death. Quitting smoking can be hard, but it is one of the best things that you can do for your health.  When you decide to quit smoking, make a plan to help you  succeed.  Quit smoking right away, not slowly over a period of time.  When you start quitting, seek help from your doctor, family, or friends. This information is not intended to replace advice given to you by your health care provider. Make sure you discuss any questions you have with your health care provider. Document Released: 02/16/2009 Document Revised: 07/10/2018 Document Reviewed: 07/11/2018 Elsevier Patient Education  2020 [employer].

## 2019-09-20 ENCOUNTER — Encounter: Payer: Self-pay | Admitting: Physician Assistant

## 2019-09-21 ENCOUNTER — Encounter: Payer: Self-pay | Admitting: Physician Assistant

## 2019-09-21 ENCOUNTER — Other Ambulatory Visit: Payer: Self-pay

## 2019-09-21 ENCOUNTER — Ambulatory Visit: Payer: 59 | Admitting: Physician Assistant

## 2019-09-21 VITALS — BP 102/70 | HR 98 | Temp 96.6°F | Resp 14 | Ht 73.5 in | Wt 151.0 lb

## 2019-09-21 DIAGNOSIS — M25512 Pain in left shoulder: Secondary | ICD-10-CM

## 2019-09-21 DIAGNOSIS — M24812 Other specific joint derangements of left shoulder, not elsewhere classified: Secondary | ICD-10-CM | POA: Diagnosis not present

## 2019-09-21 DIAGNOSIS — G8929 Other chronic pain: Secondary | ICD-10-CM | POA: Diagnosis not present

## 2019-09-21 MED ORDER — MELOXICAM 15 MG PO TABS: 15.0000 mg | ORAL_TABLET | Freq: Every day | ORAL | 0 refills | Status: DC

## 2019-09-21 NOTE — Patient Instructions (Signed)
Please avoid heavy lifting and over-exertion. Take the Meloxicam once daily with food. Tylenol for breakthrough pain. Apply topical Voltaren gel (OTC) to the area 2-3 x daily.   You will be contacted for further evaluation of this ongoing issue with Dr. Denyse Amass our Sports Medicine provider. He will take excellent care of you.
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
## 2019-10-19 IMAGING — US US SCROTUM W/ DOPPLER COMPLETE
1 series · 13 of 25 positions shown · non-contrast
Comparison: None.

CLINICAL DATA: Initial evaluation for left-sided scrotal swelling.
Assess for varicocele.

EXAM:
SCROTAL ULTRASOUND
DOPPLER ULTRASOUND OF THE TESTICLES
TECHNIQUE: Complete ultrasound examination of the testicles, epididymis, and
other scrotal structures was performed. Color and spectral Doppler
ultrasound were also utilized to evaluate blood flow to the
testicles.

[Series 1: us scrotum w/ doppler complete · 0.06mm/px · 13 of 71 slices shown]
[im 1/71]
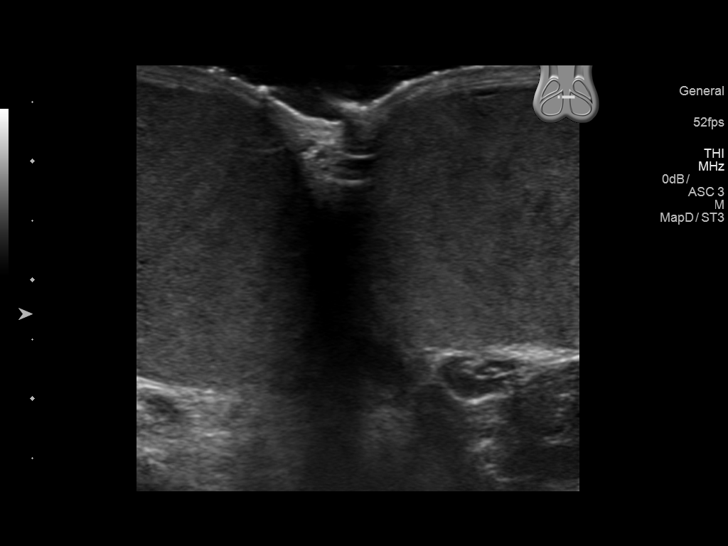
[im 6/71]
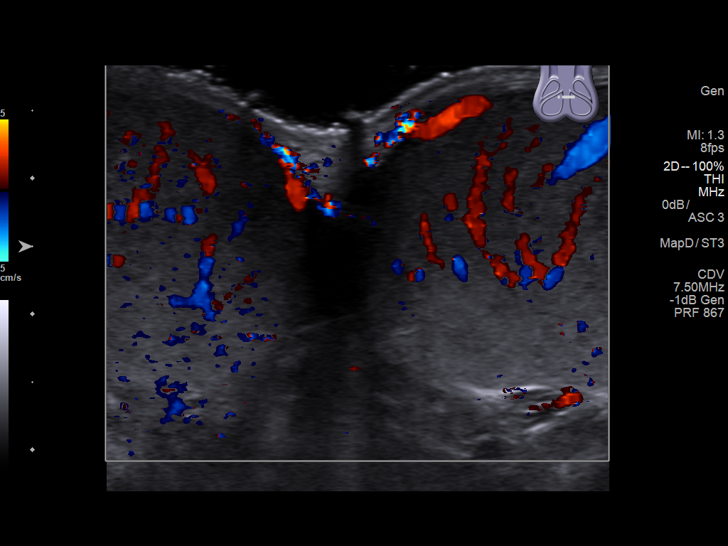
[im 12/71]
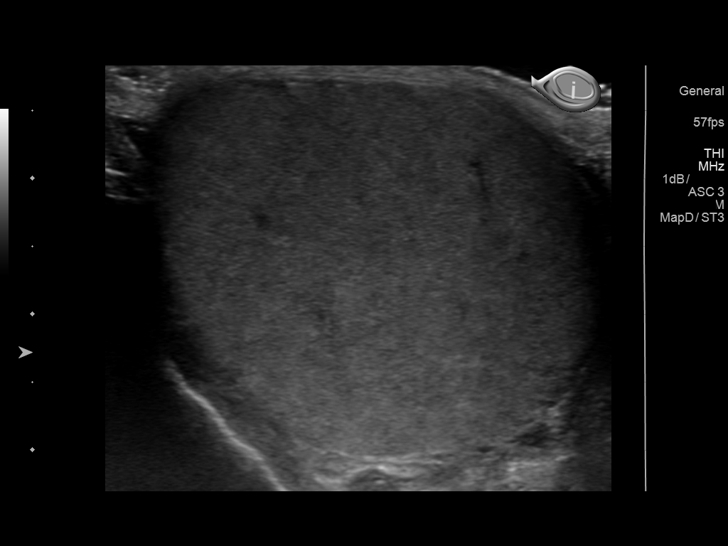
[im 18/71]
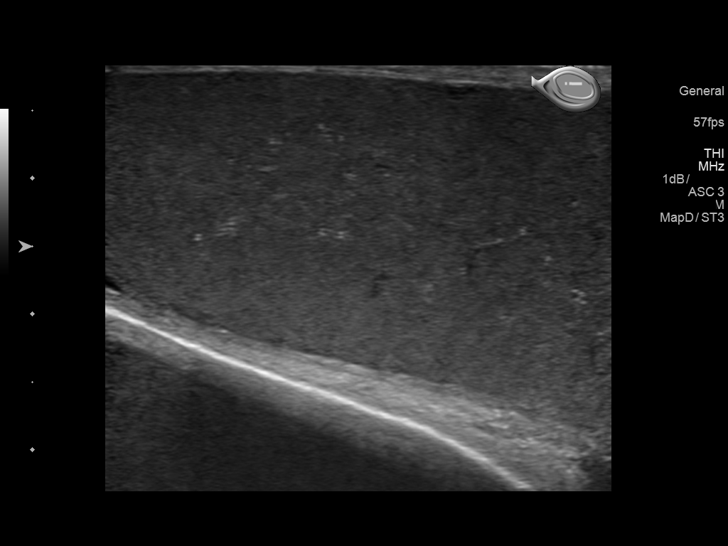
[im 24/71]
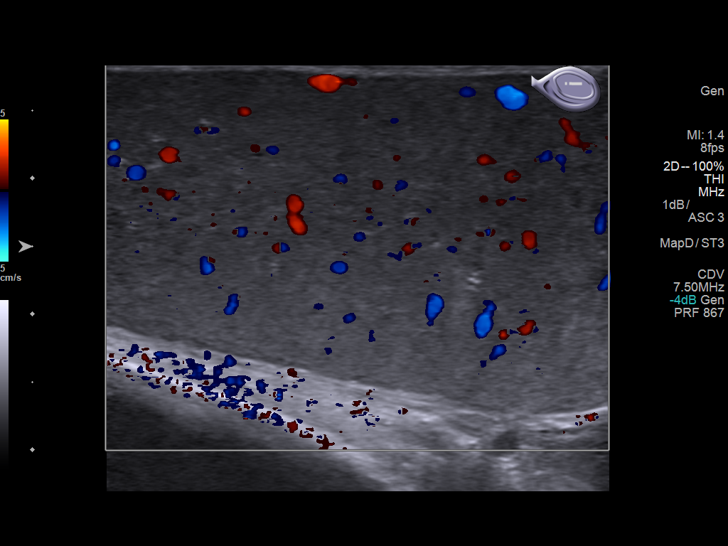
[im 30/71]
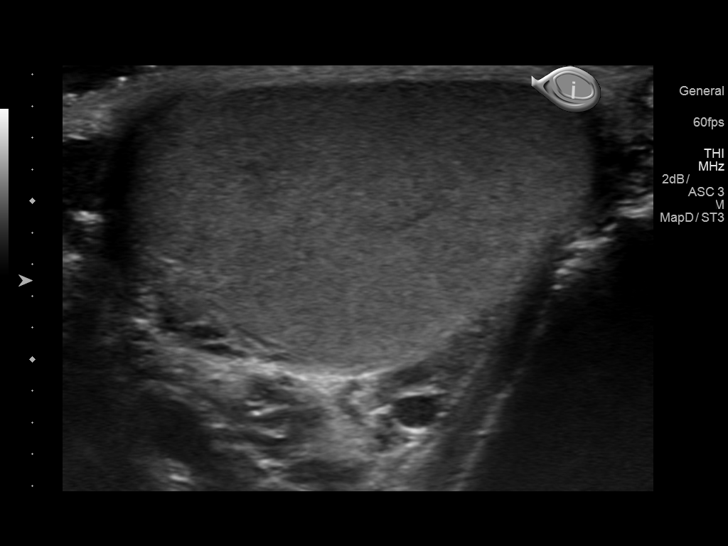
[im 36/71]
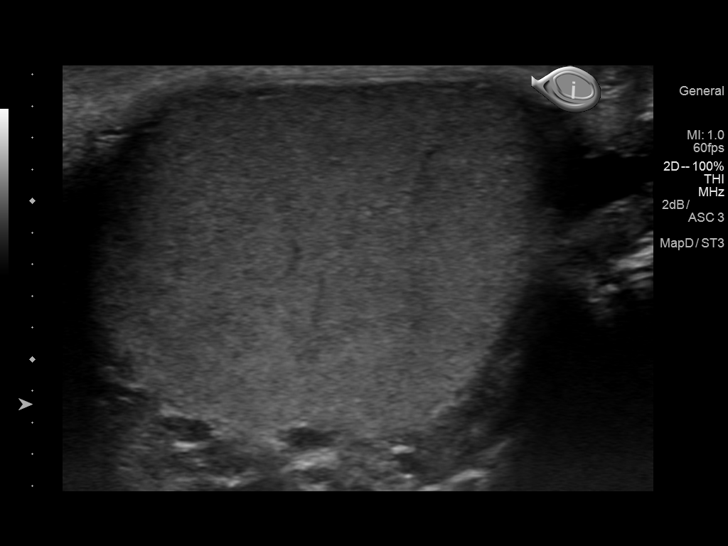
[im 41/71]
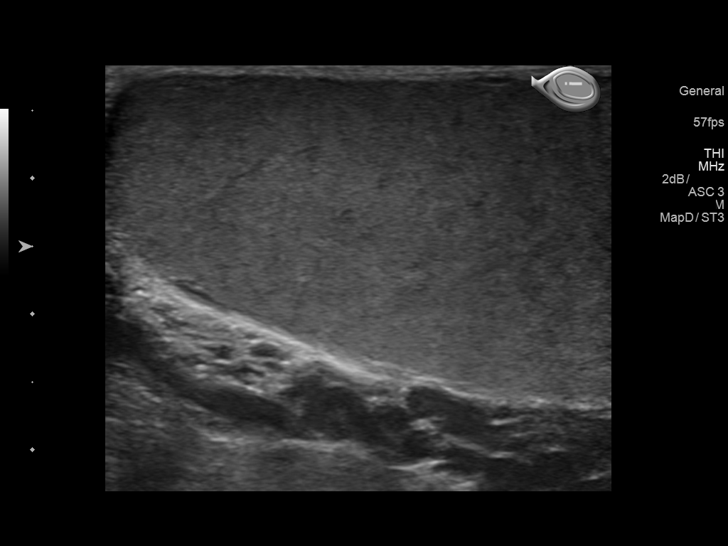
[im 47/71]
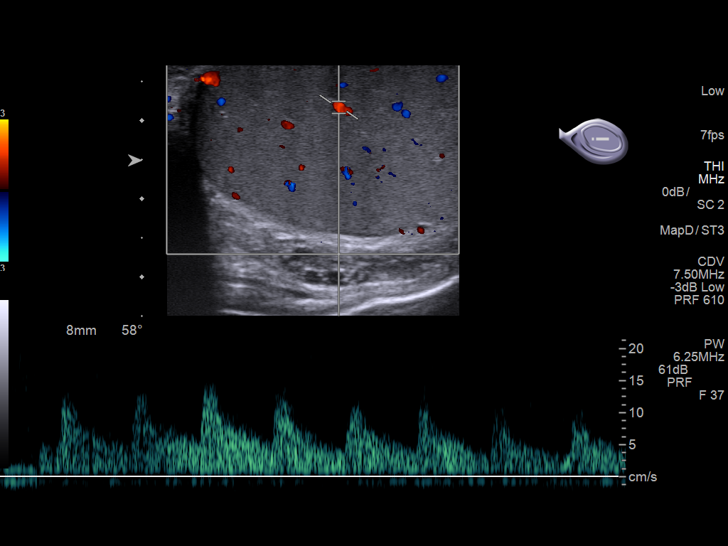
[im 53/71]
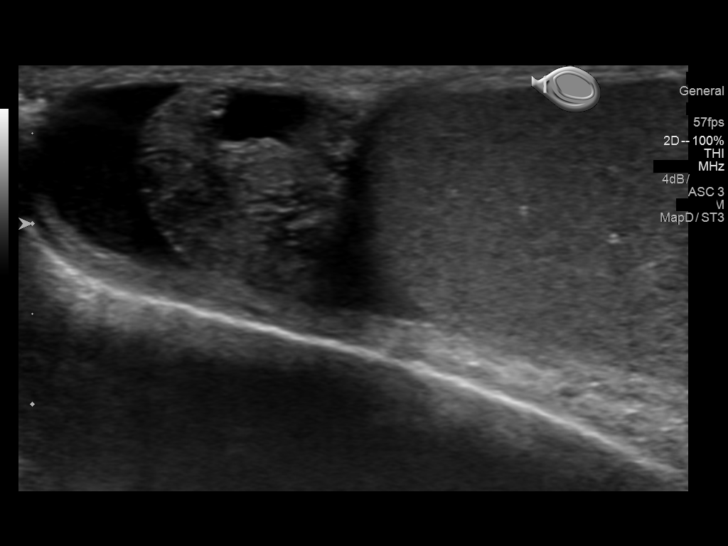
[im 59/71]
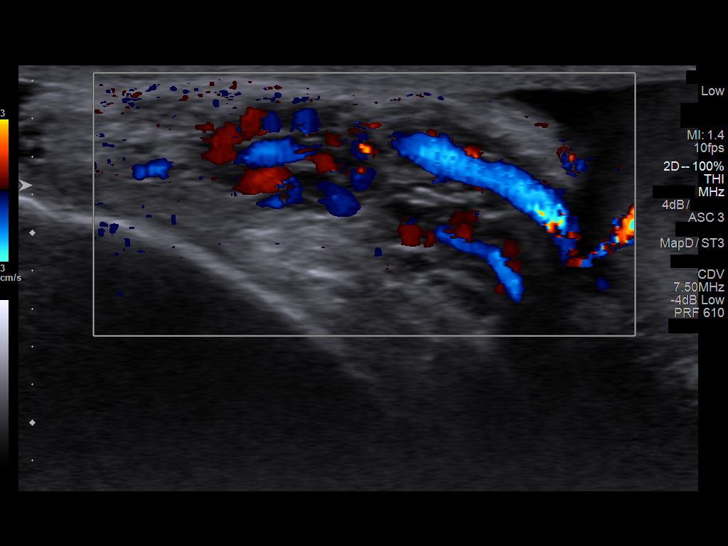
[im 65/71]
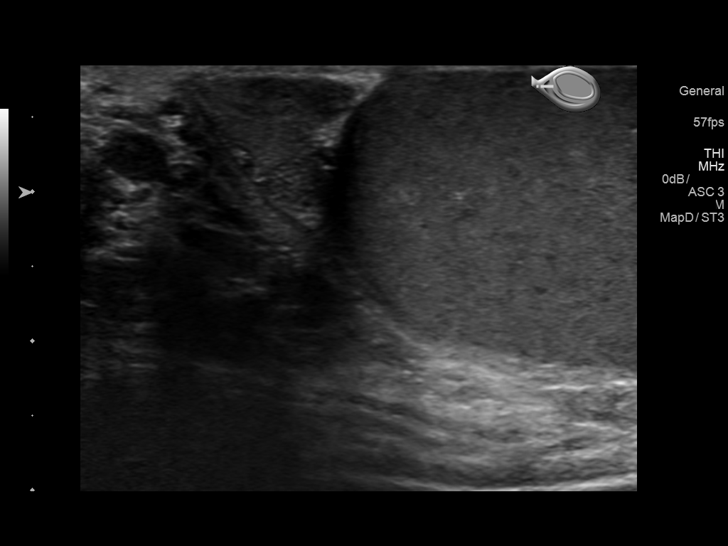
[im 71/71]
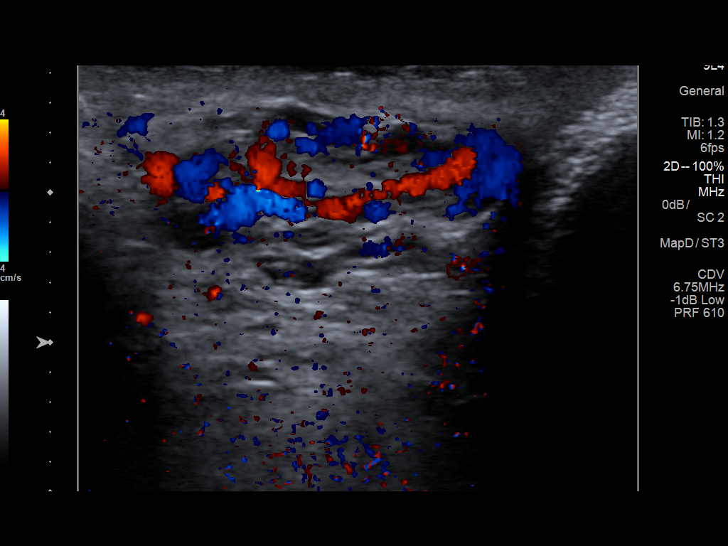

[13 of 25 positions shown; findings below may reference images not displayed]

FINDINGS: Right testicle

Measurements: 5.0 x 2.3 x 3.3 cm. No mass or microlithiasis
visualized.

Left testicle

Measurements: 5.0 x 2.4 x 3.5 cm. No mass or microlithiasis
visualized.

Right epididymis: Normal in size and appearance. 6 mm cyst at the
right epididymal head, most consistent with a small benign
epididymal cyst/spermatocele. Finding felt to be inconsequential.

Left epididymis:  Normal in size and appearance.

Hydrocele:  None visualized.

Varicocele:  Left-sided varicocele seen with Valsalva.

Pulsed Doppler interrogation of both testes demonstrates normal low
resistance arterial and venous waveforms bilaterally.
IMPRESSION: 1. Positive ultrasound for left-sided varicocele.
2. 6 mm right epididymal cyst, most consistent with a benign
epididymal cyst/spermatocele. Finding felt to be incidental in
nature, and of no clinical significance.
3. Otherwise unremarkable scrotal ultrasound.

## 2020-02-16 ENCOUNTER — Emergency Department (HOSPITAL_BASED_OUTPATIENT_CLINIC_OR_DEPARTMENT_OTHER)
Admission: EM | Admit: 2020-02-16 | Discharge: 2020-02-16 | Disposition: A | Discharge status: Home / Self Care | Payer: 59 | Attending: Emergency Medicine | Admitting: Emergency Medicine

## 2020-02-16 ENCOUNTER — Emergency Department (HOSPITAL_BASED_OUTPATIENT_CLINIC_OR_DEPARTMENT_OTHER): Payer: 59

## 2020-02-16 ENCOUNTER — Other Ambulatory Visit: Payer: Self-pay

## 2020-02-16 ENCOUNTER — Encounter (HOSPITAL_BASED_OUTPATIENT_CLINIC_OR_DEPARTMENT_OTHER): Payer: Self-pay

## 2020-02-16 DIAGNOSIS — X500XXA Overexertion from strenuous movement or load, initial encounter: Secondary | ICD-10-CM | POA: Diagnosis not present

## 2020-02-16 DIAGNOSIS — F17210 Nicotine dependence, cigarettes, uncomplicated: Secondary | ICD-10-CM | POA: Insufficient documentation

## 2020-02-16 DIAGNOSIS — N50812 Left testicular pain: Secondary | ICD-10-CM | POA: Diagnosis present

## 2020-02-16 DIAGNOSIS — I861 Scrotal varices: Secondary | ICD-10-CM | POA: Insufficient documentation

## 2020-02-16 DIAGNOSIS — Y9289 Other specified places as the place of occurrence of the external cause: Secondary | ICD-10-CM | POA: Insufficient documentation

## 2020-02-16 NOTE — Discharge Instructions (Signed)
Your ultrasound showed findings consistent with a varicocele of the left testicle. Please follow up with [facility] regarding this.  Take Ibuprofen 600 mg every 8 hours as needed for pain. I would also recommend ice to the area for symptomatic treatment.  Return to the ED for any worsening symptoms.

## 2020-02-16 NOTE — ED Provider Notes (Signed)
[facility] EMERGENCY DEPARTMENT Provider Note   CSN: [id number] Arrival date & time: 02/16/20  1422     History Chief Complaint  Patient presents with  . Testicle Pain    Mark Monroe is a 19 y.o. male who presents to the ED today with complaint of gradual onset, constant, worsening, left testicular pain that began 1-2 days ago. Pt reports he was lifting logs at work when he began feeling a strain in his testicle. He reports that the pain got worse today prompting him to come to the ED. He has noticed some swelling to the testicle as well. Pt unable to determine if anything exacerbates or alleviates the pain. He has not taken anything for pain. Pt is sexually active with 1 partner however reports always using protection. He is not concerned about STIs at this time. Pt denies fevers, chills, abdominal pain, nausea, vomiting, dysuria, urinary frequency, urgency, testicular pain, penile discharge, or any other associated symptoms. 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   The history is provided by the patient and medical records.       Past Medical History:  Diagnosis Date  . Abdominal pain   . ADHD (attention deficit hyperactivity disorder)    Eval by Dr. Kem Kays at [birth year]14/17 years of age  . Anxiety   . Diarrhea   . Frequent headaches   . Neonatal hyperbilirubinemia 07/1998   peak bili 19.0, Rx phototherapy for several days  . Otitis media     Patient Active Problem List   Diagnosis Date Noted  . Visit for preventive health examination 01/16/2018  . Social anxiety disorder 12/26/2014  . ADHD (attention deficit hyperactivity disorder) 01/24/2011    Past Surgical History:  Procedure Laterality Date  . NASOLACRIMAL DUCT PROBING  09/1999   Surgery at [facility] by Dr. Verne Carrow  . NASOLACRIMAL DUCT PROBING W/ INSERTION OF STENT  02/2000   Surgery at [facility]  after recurrence after probing  alone in 09/1999       Family History  Problem Relation Age of Onset  . Cancer Paternal Grandfather   . Heart disease Paternal Grandfather   . Hyperlipidemia Paternal Grandfather   . Irritable bowel syndrome Mother   . Anxiety disorder Mother   . Depression Mother   . Cholecystitis Father   . Hypertension Father   . Hyperlipidemia Father   . Depression Maternal Grandmother   . Hypertension Maternal Grandmother   . Heart disease Paternal Grandmother   . Hyperlipidemia Paternal Grandmother   . Cancer Paternal Grandmother   . Hereditary spherocytosis Cousin   . ADD / ADHD Cousin   . Heart disease Maternal Grandfather   . Hyperlipidemia Maternal Grandfather   . Crohn's disease Neg Hx   . Ulcerative colitis Neg Hx   . Celiac disease Neg Hx   . Ulcers Neg Hx     Social History   Tobacco Use  . Smoking status: Current Every Day Smoker    Types: Cigarettes, Cigars, E-cigarettes  . Smokeless tobacco: Current User    Types: Snuff  Vaping Use  . Vaping Use: Every day  Substance Use Topics  . Alcohol use: Yes    Comment: occ  . Drug use: Yes    Types: Marijuana    Home Medications Prior to Admission medications   Medication Sig Start Date End Date Taking? Authorizing Provider  EPINEPHrine (  EPIPEN 2-PAK) 0.3 mg/0.3 mL IJ SOAJ injection Inject 0.3 mLs (0.3 mg total) into the muscle once as needed (for severe allergic reaction). CAll 911asap if you have to use this medicine 01/22/18   Willow Ora, MD  meloxicam (MOBIC) 15 MG tablet Take 1 tablet (15 mg total) by mouth daily. 09/21/19   Waldon Merl, PA-C  GuanFACINE HCl 3 MG TB24 Take 1 tablet (3 mg total) by mouth daily. 05/03/11 07/18/11  Vernell Morgans, MD    Allergies    Wasp venom  Review of Systems   Review of Systems  Constitutional: Negative for chills and fever.  Gastrointestinal: Negative for abdominal pain, nausea and vomiting.  Genitourinary: Positive for scrotal  swelling and testicular pain. Negative for discharge, dysuria, flank pain, frequency, hematuria, penile pain and penile swelling.  All other systems reviewed and are negative.   Physical Exam Updated Vital Signs BP 125/72 (BP Location: Left Arm)   Pulse 98   Temp 98.9 F (37.2 C) (Oral)   Resp 18   Ht 6\' 4"  (1.93 m)   Wt 69.9 kg   SpO2 100%   BMI 18.75 kg/m   Physical Exam Vitals and nursing note reviewed.  Constitutional:      Appearance: He is not ill-appearing.  HENT:     Head: Normocephalic and atraumatic.  Eyes:     Conjunctiva/sclera: Conjunctivae normal.  Cardiovascular:     Rate and Rhythm: Normal rate and regular rhythm.  Pulmonary:     Effort: Pulmonary effort is normal.     Breath sounds: Normal breath sounds. No wheezing, rhonchi or rales.  Abdominal:     Palpations: Abdomen is soft.     Tenderness: There is no abdominal tenderness. There is no guarding or rebound.     Hernia: No hernia is present.  Genitourinary:    Comments: Chaperone present for exam Circumcised penis without phimosis/paraphimosis, hypospadias, erythema, tenderness, or discharge. No rashes or lesions. Left testicle with mild swelling along spermatic cord compared to right side with TTP; no erythema present. Normal cremasterics reflex present bilaterally. No abnormal lie. No inguinal hernias or adenopathy present. Musculoskeletal:     Cervical back: Neck supple.  Skin:    General: Skin is warm and dry.  Neurological:     Mental Status: He is alert.     ED Results / Procedures / Treatments   Labs (all labs ordered are listed, but only abnormal results are displayed) Labs Reviewed - No data to display  EKG None  Radiology SCROTUM W/DOPPLER  Result Date: 02/16/2020 CLINICAL DATA:  Left testicular pain after picking up log, history of left varicocele EXAM: SCROTAL ULTRASOUND DOPPLER ULTRASOUND OF THE TESTICLES TECHNIQUE: Complete ultrasound examination of the testicles,  epididymis, and other scrotal structures was performed. Color and spectral Doppler ultrasound were also utilized to evaluate blood flow to the testicles. COMPARISON:  Scrotal ultrasound 01/27/2018 FINDINGS: Right testicle Measurements: 6 x 3.1 x 2.9 cm. No mass or microlithiasis visualized. Left testicle Measurements: 5.8 x 3.3 x 3.7 cm. No mass or microlithiasis visualized. Right epididymis:  Normal in size and appearance. Left epididymis:  Normal in size and appearance. Hydrocele: Slightly asymmetric left paratesticular fluid may reflect a trace left hydrocele. Varicocele:  Redemonstration of a left varicocele. Pulsed Doppler interrogation of both testes demonstrates normal low resistance arterial and venous waveforms bilaterally. IMPRESSION: No evidence of testicular mass or torsion. Suspect a trace left hydrocele as well as a redemonstrated isolated left varicocele. Electronically Signed  By: Kreg Shropshire M.D.   On: 02/16/2020 16:16    Procedures Procedures (including critical care time)  Medications Ordered in ED Medications - No data to display  ED Course  I have reviewed the triage vital signs and the nursing notes.  Pertinent labs & imaging results that were available during my care of the patient were reviewed by me and considered in my medical decision making (see chart for details).    MDM Rules/Calculators/A&P                          19 year old male who presents to the ED today complaining of left testicular pain for the past 1 to 2 days which she noticed after lifting logs at work.  On arrival to the ED patient is afebrile, nontachycardic nontachypneic.  An ultrasound was ordered prior to patient being evaluated by ED provider as ultrasound tech was leaving for the day soon.  Her ultrasound was done however prior to being read the patient was evaluated.  On exam with chaperone present patient does have some edema noted to the testicle/spermatic cord with tenderness palpation.  No  obvious hydrocele appreciated with light shown on testicle.  Normal cremasteric reflex and normal lie.  No pain to penis itself and no penile discharge appreciated.  Patient is sexually active with 1 partner however he states that use protection always and he is unconcerned about STIs at this time.  Will await ultrasound at this time and reevaluate.  Ultrasound with findings concerning for varicocele.  Patient does report he has history of same and saw a urologist 2 years ago however pain went away on its own and he did not think much of it.  He is unsure which urologist he saw.  We will plan to have patient follow-up with urologist for same.  Ibuprofen as needed for pain advised as well as ice for symptomatic treatment.  Patient is in agreement with plan and stable for discharge home.   This note was prepared using Dragon voice recognition software and may include unintentional dictation errors due to the inherent limitations of voice recognition software.   Final Clinical Impression(s) / ED Diagnoses Final diagnoses:  Left varicocele    Rx / DC Orders ED Discharge Orders    None       Discharge Instructions     Your ultrasound showed findings consistent with a varicocele of the left testicle. Please follow up with [facility] regarding this.  Take Ibuprofen 600 mg every 8 hours as needed for pain. I would also recommend ice to the area for symptomatic treatment.  Return to the ED for any worsening symptoms.        Tanda Rockers, PA-C 02/16/20 1641    Gwyneth Sprout, MD 02/16/20 1732

## 2020-02-16 NOTE — ED Triage Notes (Signed)
Pt c/o pain to left testicle after lifting a log yesterday-NAD-steady gait

## 2020-02-16 NOTE — ED Notes (Signed)
Review D/C papers with pt, pt states understanding, pt denies questions at this time. 

## 2020-03-31 ENCOUNTER — Emergency Department (HOSPITAL_BASED_OUTPATIENT_CLINIC_OR_DEPARTMENT_OTHER)
Admission: EM | Admit: 2020-03-31 | Discharge: 2020-03-31 | Disposition: A | Discharge status: Home / Self Care | Payer: 59 | Attending: Emergency Medicine | Admitting: Emergency Medicine

## 2020-03-31 ENCOUNTER — Other Ambulatory Visit: Payer: Self-pay

## 2020-03-31 ENCOUNTER — Encounter (HOSPITAL_BASED_OUTPATIENT_CLINIC_OR_DEPARTMENT_OTHER): Payer: Self-pay | Admitting: *Deleted

## 2020-03-31 DIAGNOSIS — Z95828 Presence of other vascular implants and grafts: Secondary | ICD-10-CM | POA: Diagnosis not present

## 2020-03-31 DIAGNOSIS — Z79899 Other long term (current) drug therapy: Secondary | ICD-10-CM | POA: Insufficient documentation

## 2020-03-31 DIAGNOSIS — F17210 Nicotine dependence, cigarettes, uncomplicated: Secondary | ICD-10-CM | POA: Diagnosis not present

## 2020-03-31 DIAGNOSIS — J029 Acute pharyngitis, unspecified: Secondary | ICD-10-CM | POA: Diagnosis present

## 2020-03-31 LAB — GROUP A STREP BY PCR: Group A Strep by PCR: NOT DETECTED

## 2020-03-31 MED ORDER — ACETAMINOPHEN 325 MG PO TABS
650.0000 mg | ORAL_TABLET | Freq: Once | ORAL | Status: AC
Start: 2020-03-31 — End: 2020-03-31
  Administered 2020-03-31: 650 mg via ORAL
  Filled 2020-03-31: qty 2

## 2020-03-31 MED ORDER — PENICILLIN G BENZATHINE 1200000 UNIT/2ML IM SUSP
1.2000 10*6.[IU] | Freq: Once | INTRAMUSCULAR | Status: AC
  Administered 2020-03-31: 1.2 10*6.[IU] via INTRAMUSCULAR
  Filled 2020-03-31: qty 2

## 2020-03-31 MED ORDER — LIDOCAINE VISCOUS HCL 2 % MT SOLN
15.0000 mL | Freq: Once | OROMUCOSAL | Status: AC
  Administered 2020-03-31: 15 mL via OROMUCOSAL
  Filled 2020-03-31: qty 15

## 2020-03-31 MED ORDER — DEXAMETHASONE 6 MG PO TABS
6.0000 mg | ORAL_TABLET | Freq: Once | ORAL | Status: AC
  Administered 2020-03-31: 6 mg via ORAL
  Filled 2020-03-31: qty 1

## 2020-03-31 NOTE — ED Triage Notes (Addendum)
Sore throat x 2 days. Chills, fever, body aches. His Covid test was negative yesterday. He took Ibuprofen at 11am.

## 2020-03-31 NOTE — ED Provider Notes (Signed)
[facility] EMERGENCY DEPARTMENT Provider Note   CSN: [id number] Arrival date & time: 03/31/20  1212     History Chief Complaint  Patient presents with  . Sore Throat    Mark Monroe is a 19 y.o. male with past medical history significant for ADHD.  HPI Patient presents to emergency department today with chief complaint of progressively worsening sore throat x2 days.  He describes his pain as feeling raw and burning.  He states his pain is constant.  It is worse when trying to eat or drink.  He is also endorsing fever, T-max of 101 today x1 hour prior to arrival.  He did take ibuprofen then.  He denies chills, cough, congestion, otalgia, sinus pressure, shortness of breath, chest pain, abdominal pain, nausea, emesis, rash.  Denies any sick contacts.  He did take a home Covid test yesterday that was negative.    Past Medical History:  Diagnosis Date  . Abdominal pain   . ADHD (attention deficit hyperactivity disorder)    Eval by Dr. Kem Kays at [birth year]14[birth year]19 years of age  . Anxiety   . Diarrhea   . Frequent headaches   . Neonatal hyperbilirubinemia 07/1998   peak bili 19.0, Rx phototherapy for several days  . Otitis media     Patient Active Problem List   Diagnosis Date Noted  . Visit for preventive health examination 01/16/2018  . Social anxiety disorder 12/26/2014  . ADHD (attention deficit hyperactivity disorder) 01/24/2011    Past Surgical History:  Procedure Laterality Date  . NASOLACRIMAL DUCT PROBING  09/1999   Surgery at [facility] by Dr. Verne Carrow  . NASOLACRIMAL DUCT PROBING W/ INSERTION OF STENT  02/2000   Surgery at [facility] after recurrence after probing  alone in 09/1999       Family History  Problem Relation Age of Onset  . Cancer Paternal Grandfather   . Heart disease Paternal Grandfather   . Hyperlipidemia Paternal Grandfather   . Irritable bowel syndrome Mother   . Anxiety disorder Mother   . Depression Mother   . Cholecystitis Father   .  Hypertension Father   . Hyperlipidemia Father   . Depression Maternal Grandmother   . Hypertension Maternal Grandmother   . Heart disease Paternal Grandmother   . Hyperlipidemia Paternal Grandmother   . Cancer Paternal Grandmother   . Hereditary spherocytosis Cousin   . ADD / ADHD Cousin   . Heart disease Maternal Grandfather   . Hyperlipidemia Maternal Grandfather   . Crohn's disease Neg Hx   . Ulcerative colitis Neg Hx   . Celiac disease Neg Hx   . Ulcers Neg Hx     Social History   Tobacco Use  . Smoking status: Current Every Day Smoker    Types: Cigarettes, Cigars, E-cigarettes  . Smokeless tobacco: Current User    Types: Snuff  Vaping Use  . Vaping Use: Every day  Substance Use Topics  . Alcohol use: Yes    Comment: occ  . Drug use: Yes    Types: Marijuana    Home Medications Prior to Admission medications   Medication Sig Start Date End Date Taking? Authorizing Provider  EPINEPHrine (EPIPEN 2-PAK) 0.3 mg/0.3 mL IJ SOAJ injection Inject 0.3 mLs (0.3 mg total) into the muscle once as needed (for severe allergic reaction). CAll 911asap if you have to use this medicine 01/22/18  Yes Willow Ora, MD  GuanFACINE HCl 3 MG TB24 Take 1 tablet (3 mg total) by mouth daily. 05/03/11  07/18/11  Vernell Morgans, MD    Allergies    Wasp venom, Bee venom, and Poison ivy extract  Review of Systems   Review of Systems All other systems are reviewed and are negative for acute change except as noted in the HPI.  Physical Exam Updated Vital Signs BP 118/81   Pulse 100   Temp 100 F (37.8 C) (Oral)   Resp (!) 22   Ht 6\' 3"  (1.905 m)   Wt 68 kg   SpO2 100%   BMI 18.75 kg/m   Physical Exam Vitals and nursing note reviewed.  Constitutional:      Appearance: He is well-developed. He is not ill-appearing or toxic-appearing.  HENT:     Head: Normocephalic and atraumatic.     Comments: Minor erythema to oropharynx, white exudate on bilateral tonsils, 2+ tonsillar  swelling, voice normal, neck supple without bilateral cervical lymphadenoapthy    Nose: Nose normal.  Eyes:     General: No scleral icterus.       Right eye: No discharge.        Left eye: No discharge.     Conjunctiva/sclera: Conjunctivae normal.  Neck:     Vascular: No JVD.     Comments: No posterior neck tenderness Cardiovascular:     Rate and Rhythm: Normal rate and regular rhythm.     Pulses: Normal pulses.     Heart sounds: Normal heart sounds.  Pulmonary:     Effort: Pulmonary effort is normal.     Breath sounds: Normal breath sounds.  Abdominal:     General: There is no distension.  Musculoskeletal:        General: Normal range of motion.     Cervical back: Normal range of motion.  Skin:    General: Skin is warm and dry.  Neurological:     Mental Status: He is oriented to person, place, and time.     GCS: GCS eye subscore is 4. GCS verbal subscore is 5. GCS motor subscore is 6.     Comments: Fluent speech, no facial droop.  Psychiatric:        Behavior: Behavior normal.     ED Results / Procedures / Treatments   Labs (all labs ordered are listed, but only abnormal results are displayed) Labs Reviewed  GROUP A STREP BY PCR    EKG None  Radiology No results found.  Procedures Procedures (including critical care time)  Medications Ordered in ED Medications  acetaminophen (TYLENOL) tablet 650 mg (has no administration in time range)  lidocaine (XYLOCAINE) 2 % viscous mouth solution 15 mL (has no administration in time range)  dexamethasone (DECADRON) tablet 6 mg (has no administration in time range)  penicillin g benzathine (BICILLIN LA) 1200000 UNIT/2ML injection 1.2 Million Units (has no administration in time range)    ED Course  I have reviewed the triage vital signs and the nursing notes.  Pertinent labs & imaging results that were available during my care of the patient were reviewed by me and considered in my medical decision making (see chart  for details).    MDM Rules/Calculators/A&P                          History provided by patient with additional history obtained from chart review.    19 yo male who presents with fever, sore throat. Still able to tolerate PO/secretions but with worsening pain. Patient is afebrile at 100.0 in triage  although took ibuprofen 1 hour prior to arrival. When I checked patient's temperature during exam it is 101, He is uncomfortable appearing although  non-toxic appearing, sitting comfortably on examination table. On exam, he has 2+ swelling of bilateral tonsils with white exudate on both/ Uvula is midline without swelling.. Presentation not concerning for PTA or Ludwig's angina, Uvulitis, epiglottitis, peritonsillar abscess, or retropharyngeal abscess. Strep ordered at triage.   Strep reviewed and is negative. Discussed results with patient and when doing so he admits the swab did not feel like it touched his tonsils. With concern for possibility of inaccurate results will go ahead and treat with IM penicillin, steroids while in the ED. Pt does not appear dehydrated, but did discuss importance of water rehydration.  Encouraged at home supportive care measures. Patient successfully fluid challenged in the ED without difficulty swallowing.  Strict return precautions given. NAD. VSS. Recommended PCP follow up for re-evaluation.    Portions of this note were generated with Scientist, clinical (histocompatibility and immunogenetics). Dictation errors may occur despite best attempts at proofreading.   Final Clinical Impression(s) / ED Diagnoses Final diagnoses:  Sore throat    Rx / DC Orders ED Discharge Orders    None       Kandice Hams 03/31/20 1642    Arby Barrette, MD 04/01/20 1545

## 2020-03-31 NOTE — Discharge Instructions (Addendum)
Your strep test was negative however as we discussed your exam is consistent with bacterial strep throat.    You were treated with a shot of Penicillin and a pill of Decadron.  - Penicillin is an antibiotic used to treat the infection - Decadron is a steroid used to treat the pain and swelling of your throat.   You should gradually feel better over the next few days. Take Tylenol and Ibuprofen for fever and pain. Follow up with your primary care provider in the next 1 week if you are not feeling better, if you do not have a primary care provider one is provided in your discharge instructions. Return to the emergency department for any new or worsening symptoms including but not limited to inability to open your mouth, inability to move your neck, worsening pain, change in your voice, inability to swallow your own saliva, drooling, or any other concerns.

## 2020-03-31 NOTE — ED Notes (Signed)
No adverse reaction to penicillin

## 2020-04-03 ENCOUNTER — Encounter: Payer: Self-pay | Admitting: Physician Assistant

## 2020-04-04 ENCOUNTER — Other Ambulatory Visit: Payer: Self-pay

## 2020-04-04 ENCOUNTER — Telehealth (INDEPENDENT_AMBULATORY_CARE_PROVIDER_SITE_OTHER): Payer: 59 | Admitting: Physician Assistant

## 2020-04-04 ENCOUNTER — Encounter: Payer: Self-pay | Admitting: Physician Assistant

## 2020-04-04 DIAGNOSIS — J029 Acute pharyngitis, unspecified: Secondary | ICD-10-CM | POA: Diagnosis not present

## 2020-04-04 MED ORDER — PREDNISONE 10 MG PO TABS: ORAL_TABLET | ORAL | 0 refills | Status: AC

## 2020-04-04 MED ORDER — CEFDINIR 300 MG PO CAPS: 300.0000 mg | ORAL_CAPSULE | Freq: Two times a day (BID) | ORAL | 0 refills | Status: DC

## 2020-04-04 NOTE — Telephone Encounter (Signed)
Please call patient to schedule video ER follow-up.

## 2020-04-04 NOTE — Progress Notes (Signed)
Virtual Visit via Video   I connected with patient on 04/04/20 at 11:30 AM EST by a video enabled telemedicine application and verified that I am speaking with the correct person using two identifiers.  Location patient: Home Location provider: [place] [facility], Office Persons participating in the virtual visit: Patient, Provider, CMA (Patina Moore)  I discussed the limitations of evaluation and management by telemedicine and the availability of in person appointments. The patient expressed understanding and agreed to proceed.  Subjective:   HPI:   Patient presents via [facility] today for ER follow-up. Patient presented to the emergency room on 03/31/2020 with complaints of 2 days of progressively worsening sore throat and painful swallowing. Had also noted fever as of that morning. ER work-up included examination revealing tonsillar swelling with bilateral exudate. Strep testing was negative but there was some question to how the test was handled. Patient was given IM penicillin and a dose of dexamethasone. Was discharged home same day to follow-up with primary care. Patient states symptoms are still present. Noted some initial improvement but now just as severe. Is unsure of any recurring fever. States occasionally feels warm but has not checked his temperature. Denies any sinus pressure, sinus pain, ear pain or tooth pain. Denies difficulty swallowing. Denies chest congestion, cough or shortness of breath. Denies any sick contact. Denies any known potential exposure to mono. Is taking ibuprofen to help with sore throat with only some relief.  ROS:   See pertinent positives and negatives per HPI.  Patient Active Problem List   Diagnosis Date Noted  . Visit for preventive health examination 01/16/2018  . Social anxiety disorder 12/26/2014  . ADHD (attention deficit hyperactivity disorder) 01/24/2011    Social History   Tobacco Use  . Smoking status: Current Every Day Smoker     Types: Cigarettes, Cigars, E-cigarettes  . Smokeless tobacco: Current User    Types: Snuff  Substance Use Topics  . Alcohol use: Yes    Comment: occ    Current Outpatient Medications:  .  EPINEPHrine (EPIPEN 2-PAK) 0.3 mg/0.3 mL IJ SOAJ injection, Inject 0.3 mLs (0.3 mg total) into the muscle once as needed (for severe allergic reaction). CAll 911asap if you have to use this medicine (Patient not taking: Reported on 04/04/2020), Disp: 62 Device, Rfl: 0  Allergies  Allergen Reactions  . Wasp Venom Hives, Itching and Swelling    Developed swelling of soft tissues in L hand and tongue, systemic itching, hives, and rash after being stung  . Bee Venom Hives, Itching and Swelling  . Poison Ivy Extract Rash    Objective:   There were no vitals taken for this visit.  Patient is well-developed, well-nourished in no acute distress.  Resting comfortably at home.  Head is normocephalic, atraumatic.  No labored breathing.  Speech is clear and coherent with logical content.  Patient is alert and oriented at baseline.   Assessment and Plan:   1. Pharyngitis, unspecified etiology Insert for strep giving tonsillar exudate and lack of other URI symptoms. Hard to visualize oropharynx on examination. Patient speaking clearly and denies any difficulty with swallowing. We'll have him alternate Tylenol and ibuprofen for pain and swelling. Start salt water gargles. Dietary recommendations reviewed. Began prednisone taper and cefdinir. If not improving he is to follow-up in office so that we can repeat strep testing and obtain mono testing. Strict ER precautions reviewed with patient who voiced understanding and agreement plan. - predniSONE (DELTASONE) 10 MG tablet; Take 3 tablets (30 mg  total) by mouth daily with breakfast for 2 days, THEN 2 tablets (20 mg total) daily with breakfast for 2 days, THEN 1 tablet (10 mg total) daily with breakfast for 2 days.  Dispense: 12 tablet; Refill: 0 - cefdinir  (OMNICEF) 300 MG capsule; Take 1 capsule (300 mg total) by mouth 2 (two) times daily.  Dispense: 20 capsule; Refill: 0    Piedad Climes, [place] 04/04/2020

## 2020-04-04 NOTE — Progress Notes (Signed)
I have discussed the procedure for the virtual visit with the patient who has given consent to proceed with assessment and treatment.   Arion Shankles S Jalaya Sarver, CMA     

## 2020-08-29 ENCOUNTER — Encounter: Payer: 59 | Admitting: Family Medicine

## 2020-09-05 ENCOUNTER — Encounter: Payer: 59 | Admitting: Family Medicine

## 2020-11-07 ENCOUNTER — Encounter: Payer: 59 | Admitting: Family Medicine

## 2020-11-10 ENCOUNTER — Ambulatory Visit (INDEPENDENT_AMBULATORY_CARE_PROVIDER_SITE_OTHER): Payer: 59 | Admitting: Family Medicine

## 2020-11-10 ENCOUNTER — Other Ambulatory Visit: Payer: Self-pay

## 2020-11-10 ENCOUNTER — Encounter: Payer: Self-pay | Admitting: Family Medicine

## 2020-11-10 VITALS — BP 120/88 | HR 67 | Temp 98.0°F | Ht 75.0 in | Wt 153.0 lb

## 2020-11-10 DIAGNOSIS — M25511 Pain in right shoulder: Secondary | ICD-10-CM

## 2020-11-10 DIAGNOSIS — T63461A Toxic effect of venom of wasps, accidental (unintentional), initial encounter: Secondary | ICD-10-CM | POA: Insufficient documentation

## 2020-11-10 DIAGNOSIS — T63461S Toxic effect of venom of wasps, accidental (unintentional), sequela: Secondary | ICD-10-CM | POA: Diagnosis not present

## 2020-11-10 DIAGNOSIS — M25512 Pain in left shoulder: Secondary | ICD-10-CM

## 2020-11-10 DIAGNOSIS — Z0001 Encounter for general adult medical examination with abnormal findings: Secondary | ICD-10-CM

## 2020-11-10 NOTE — Assessment & Plan Note (Signed)
Has EpiPen.  Does not need refill today.

## 2020-11-10 NOTE — Patient Instructions (Signed)
It was very nice to see you today!  Please work on the shoulder exercises.  Let me know if this continues to give you an issue.  I will see back in year for your next checkup.  Please come back to see me sooner if needed.  Take care, Dr Jimmey Ralph  PLEASE NOTE:  If you had any lab tests please let us know if you have not heard back within a few days. You may see your results on mychart before we have a chance to review them but we will give you a call once they are reviewed by [place]. If we ordered any referrals today, please let us know if you have not heard from their office within the next week.   Please try these tips to maintain a healthy lifestyle:  Eat at least 3 REAL meals and 1-2 snacks per day.  Aim for no more than 5 hours between eating.  If you eat breakfast, please do so within one hour of getting up.   Each meal should contain half fruits/vegetables, one quarter protein, and one quarter carbs (no bigger than a computer mouse)  Cut down on sweet beverages. This includes juice, soda, and sweet tea.   Drink at least 1 glass of water with each meal and aim for at least 8 glasses per day  Exercise at least 150 minutes every week.    Preventive Care 68-39 Years Old, Male Preventive care refers to lifestyle choices and visits with your health care provider that can promote health and wellness. This includes: A yearly physical exam. This is also called an annual wellness visit. Regular dental and eye exams. Immunizations. Screening for certain conditions. Healthy lifestyle choices, such as: Eating a healthy diet. Getting regular exercise. Not using drugs or products that contain nicotine and tobacco. Limiting alcohol use. What can I expect for my preventive care visit? Physical exam Your health care provider may check your: Height and weight. These may be used to calculate your BMI (body mass index). BMI is a measurement that tells if you are at a healthy weight. Heart rate and  blood pressure. Body temperature. Skin for abnormal spots. Counseling Your health care provider may ask you questions about your: Past medical problems. Family's medical history. Alcohol, tobacco, and drug use. Emotional well-being. Home life and relationship well-being. Sexual activity. Diet, exercise, and sleep habits. Work and work Astronomer. Access to firearms. What immunizations do I need?  Vaccines are usually given at various ages, according to a schedule. Your health care provider will recommend vaccines for you based on your age, medicalhistory, and lifestyle or other factors, such as travel or where you work. What tests do I need? Blood tests Lipid and cholesterol levels. These may be checked every 5 years starting at age 74. Hepatitis C test. Hepatitis B test. Screening  Diabetes screening. This is done by checking your blood sugar (glucose) after you have not eaten for a while (fasting). Genital exam to check for testicular cancer or hernias. STD (sexually transmitted disease) testing, if you are at risk. Talk with your health care provider about your test results, treatment options,and if necessary, the need for more tests. Follow these instructions at home: Eating and drinking  Eat a healthy diet that includes fresh fruits and vegetables, whole grains, lean protein, and low-fat dairy products. Drink enough fluid to keep your urine pale yellow. Take vitamin and mineral supplements as recommended by your health care provider. Do not drink alcohol if your health  care provider tells you not to drink. If you drink alcohol: Limit how much you have to 0-2 drinks a day. Be aware of how much alcohol is in your drink. In the [place] one drink equals one 12 oz bottle of beer (355 mL), one 5 oz glass of wine (148 mL), or one 1 oz glass of hard liquor (44 mL).  Lifestyle Take daily care of your teeth and gums. Brush your teeth every morning and night with fluoride  toothpaste. Floss one time each day. Stay active. Exercise for at least 30 minutes 5 or more days each week. Do not use any products that contain nicotine or tobacco, such as cigarettes, e-cigarettes, and chewing tobacco. If you need help quitting, ask your health care provider. Do not use drugs. If you are sexually active, practice safe sex. Use a condom or other form of protection to prevent STIs (sexually transmitted infections). Find healthy ways to cope with stress, such as: Meditation, yoga, or listening to music. Journaling. Talking to a trusted person. Spending time with friends and family. Safety Always wear your seat belt while driving or riding in a vehicle. Do not drive: If you have been drinking alcohol. Do not ride with someone who has been drinking. When you are tired or distracted. While texting. Wear a helmet and other protective equipment during sports activities. If you have firearms in your house, make sure you follow all gun safety procedures. Seek help if you have been physically or sexually abused. What's next? Go to your health care provider once a year for an annual wellness visit. Ask your health care provider how often you should have your eyes and teeth checked. Stay up to date on all vaccines. This information is not intended to replace advice given to you by your health care provider. Make sure you discuss any questions you have with your healthcare provider. Document Revised: 01/06/2019 Document Reviewed: 04/16/2018 Elsevier Patient Education  2022 [employer].

## 2020-11-10 NOTE — Progress Notes (Signed)
Chief Complaint:  Mark Monroe is a 20 y.o. male who presents today for his annual comprehensive physical exam.    Assessment/Plan:  New/Acute Problems: Shoulder pain No red flags.  Possibly mild subluxation.  Discussed home exercises and handout was given.  He will let me know if this continues to be an issue.  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  Does not need refill today.  Preventative Healthcare: Tdap deferred.  We can check labs again next year.  Patient Counseling(The following topics were reviewed and/or handout was given):  -Nutrition: Stressed importance of moderation in sodium/caffeine intake, saturated fat and cholesterol, caloric balance, sufficient intake of fresh fruits, vegetables, and fiber.  -Stressed the importance of regular exercise.   -Substance Abuse: Discussed cessation/primary prevention of tobacco, alcohol, or other drug use; driving or other dangerous activities under the influence; availability of treatment for abuse.   -Injury prevention: Discussed safety belts, safety helmets, smoke detector, smoking near bedding or upholstery.   -Sexuality: Discussed sexually transmitted diseases, partner selection, use of condoms, avoidance of unintended pregnancy and contraceptive alternatives.   -Dental health: Discussed importance of regular tooth brushing, flossing, and dental visits.  -Health maintenance and immunizations reviewed. Please refer to Health maintenance section.  Return to care in 1 year for next preventative visit.    2 Subjective:  HPI:  He has no acute complaints today.  He states that he sometimes gets a lateral "catch" on his right side, concerned it is possibly due to stress. Additionally he states that his shoulders pop occasionally. He is managing his ADHD well. Since being stung by a wasp and suffering anaphylaxis, he carries an Epipen. 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 He states that sometimes when he wakes up in the morning he has issues rotating his arms due to the popping. Lifestyle Diet: He is eating a reasonably healthy diet  Exercise: Job is strenuous, provides significant exercise.  Depression screen PHQ 2/9 04/04/2020  Decreased Interest 0  Down, Depressed, Hopeless 0  PHQ - 2 Score 0  Altered sleeping -  Tired, decreased energy -  Change in appetite -  Feeling bad or failure about yourself  -  Trouble concentrating -  Moving slowly or fidgety/restless -  Suicidal thoughts -  PHQ-9 Score -    Health Maintenance Due  Topic Date Due   Pneumococcal Vaccine 37-9 Years old (1 - PPSV23 or PCV20) 07/31/2000   HIV Screening  Never done   Hepatitis C Screening  Never done   TETANUS/TDAP  08/01/2019     ROS: Per HPI, otherwise a complete review of systems was negative.   PMH:  The following were reviewed and entered/updated in epic: Past Medical History:  Diagnosis Date   Abdominal pain    ADHD (attention deficit hyperactivity disorder)    Eval by Dr. 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 Verne Carrow   NASOLACRIMAL DUCT PROBING W/ INSERTION OF STENT  02/2000   Surgery at [facility] after recurrence after probing  alone in 09/1999    Family History  Problem Relation Age of Onset   Cancer Paternal Grandfather    Heart disease Paternal Grandfather    Hyperlipidemia Paternal Grandfather    Irritable bowel syndrome Mother    Anxiety disorder Mother     Depression Mother    Cholecystitis Father    Hypertension Father    Hyperlipidemia Father    Depression Maternal Grandmother    Hypertension Maternal Grandmother    Heart disease Paternal Grandmother    Hyperlipidemia Paternal Grandmother    Cancer Paternal Grandmother    Hereditary spherocytosis Cousin    ADD / ADHD Cousin    Heart disease Maternal Grandfather    Hyperlipidemia Maternal Grandfather    Crohn's disease Neg Hx    Ulcerative colitis Neg Hx    Celiac disease Neg Hx    Ulcers Neg Hx     Medications- reviewed and updated Current Outpatient Medications  Medication Sig Dispense Refill   EPINEPHrine (EPIPEN 2-PAK) 0.3 mg/0.3 mL IJ SOAJ injection Inject 0.3 mLs (0.3 mg total) into the muscle once as needed (for severe allergic reaction). CAll 911asap if you have to use this medicine 62 Device 0   No current facility-administered medications for this visit.    Allergies-reviewed and updated Allergies  Allergen Reactions   Wasp Venom Hives, Itching and Swelling    Developed swelling of soft tissues in L hand and tongue, systemic itching, hives, and rash after being stung   Bee Venom Hives, Itching and Swelling   Poison Ivy Extract Rash    Social History   Socioeconomic History   Marital status: Single    Spouse name: Not on file   Number of children: Not on file   Years of education: Not on file   Highest education level: Not on file  Occupational History   Not on file  Tobacco Use   Smoking status: Every Day    Pack years: 0.00    Types: Cigarettes, Cigars, E-cigarettes   Smokeless tobacco: Current    Types: Snuff  Vaping Use   Vaping Use: Every day  Substance and Sexual Activity   Alcohol use: Yes    Comment: occ   Drug use: Yes    Types: Marijuana   Sexual activity: Not Currently  Other Topics Concern   Not on file  Social History Narrative   Not on file   Social Determinants of Health   Financial Resource Strain: Not on file  Food  Insecurity: Not on file  Transportation Needs: Not on file  Physical Activity: Not on file  Stress: Not on file  Social Connections: Not on file        Objective:  Physical Exam: BP 120/88 (BP Location: Left Arm, Patient Position: Sitting, Cuff Size: Normal)   Pulse 67   Temp 98 F (36.7 C) (Temporal)   Ht 6\' 3"  (1.905 m)   Wt 153 lb (69.4 kg)   SpO2 97%   BMI 19.12 kg/m   Body mass index is 19.12 kg/m. Wt Readings from Last 3 Encounters:  11/10/20 153 lb (69.4 kg)  03/31/20 150 lb (68 kg)  02/16/20 154 lb (69.9 kg)   Gen: NAD, resting comfortably HEENT: TMs normal bilaterally. OP clear. No thyromegaly noted.  CV: RRR with no murmurs appreciated Pulm: NWOB, CTAB with no crackles, wheezes, or rhonchi GI: Normal bowel sounds present. Soft, Nontender, Nondistended. MSK: no edema, cyanosis,  or clubbing noted.  Bilateral shoulders without deformity.  Palpable click noted with active extension and abduction of bilateral shoulders. Skin: warm, dry Neuro: CN2-12 grossly intact. Strength 5/5 in upper and lower extremities. Reflexes symmetric and intact bilaterally.  Psych: Normal affect and thought content     I,Jordan Kelly,acting as a scribe for Jacquiline Doe, MD.,have documented all relevant documentation on the behalf of Jacquiline Doe, MD,as directed by  Jacquiline Doe, MD while in the presence of Jacquiline Doe, MD.  I, Jacquiline Doe, MD, have reviewed all documentation for this visit. The documentation on 11/10/20 for the exam, diagnosis, procedures, and orders are all accurate and complete.  Katina Degree. Jimmey Ralph, MD 11/10/2020 3:53 PM

## 2021-11-16 ENCOUNTER — Ambulatory Visit: Payer: 59 | Admitting: Family Medicine

## 2021-11-16 ENCOUNTER — Encounter: Payer: Self-pay | Admitting: Family Medicine

## 2021-11-16 VITALS — BP 110/78 | HR 56 | Temp 98.6°F | Ht 75.0 in | Wt 149.4 lb

## 2021-11-16 DIAGNOSIS — F411 Generalized anxiety disorder: Secondary | ICD-10-CM

## 2021-11-16 DIAGNOSIS — Z23 Encounter for immunization: Secondary | ICD-10-CM

## 2021-11-16 DIAGNOSIS — T148XXA Other injury of unspecified body region, initial encounter: Secondary | ICD-10-CM | POA: Diagnosis not present

## 2021-11-16 MED ORDER — SERTRALINE HCL 100 MG PO TABS: 100.0000 mg | ORAL_TABLET | Freq: Every day | ORAL | 3 refills | Status: DC

## 2021-11-16 NOTE — Progress Notes (Signed)
   Mark Monroe is a 21 y.o. male who presents today for an office visit.  Assessment/Plan:  New/Acute Problems: Abrasion We will update tetanus vaccine today.  Do not see any obvious puncture wound-do not think he needs any prophylactic antibiotics at this point.  He will keep the area clean and dry.  We discussed reasons to return to care.  Neck pain No red flags.  Consistent with muscular strain.  Continue with conservative management.  We discussed reasons to return to care.  Chronic Problems Addressed Today: Generalized anxiety disorder Not controlled.  We discussed treatment options.  He has had genetic testing done in the past which show that he would respond well to most SSRIs with exception of Luvox, Lexapro, and Celexa.  We will try Zoloft.  He has been also past but does not member how he did with this.  Start 50 mg daily for 1 to 2 weeks and then increase to 100 mg daily.  We discussed potential side effects.  He will follow-up in a few weeks.     Subjective:  HPI:  See A/P for status of chronic conditions.  Patient is here today to discuss his anxiety.  This is been worsening for the last few months.  He has previously been on medication in the past which worked for this however has not been on anything for the past years.  Is been under a lot of stress recently in his personal life due to financial stress and social stress.  Also recently lost a good friend to cancer which has been a significant source of stress for him as well.  He has a good support system with his friends and family.  No reported SI or HI.  He is interested in restarting medication.  Does not wish to see a therapist at this point.  He also fell a few days ago at the job site.  Landed on his face.  He has had a bit of left-sided neck pain since then.  This seems to be improving.  Worse with certain motions.  This morning he also stepped on a nail.  Was at his job site when he felt a nail go through the bottom  of his shoe.  It barely broke the surface of the skin.  Minimal bleeding.        Objective:  Physical Exam: BP 110/78   Pulse (!) 56   Temp 98.6 F (37 C) (Temporal)   Ht 6\' 3"  (1.905 m)   Wt 149 lb 6.4 oz (67.8 kg)   SpO2 99%   BMI 18.67 kg/m   Wt Readings from Last 3 Encounters:  11/16/21 149 lb 6.4 oz (67.8 kg)  11/10/20 153 lb (69.4 kg)  03/31/20 150 lb (68 kg)    Gen: No acute distress, resting comfortably CV: Regular rate and rhythm with no murmurs appreciated Pulm: Normal work of breathing, clear to auscultation bilaterally with no crackles, wheezes, or rhonchi Skin: Faint superficial abrasion on the solar aspect of right foot MSK: - Neck: No deformities.  No midline tenderness.  Tenderness palpation along the left paraspinal cervical muscles.  Pain worsened with leftward rotation.  Neurovascular intact distally.  Strength 5 out of 5 in all extremities.   Neuro: Grossly normal, moves all extremities Psych: Normal affect and thought content      Davaris Youtsey M. 04/02/20, MD 11/16/2021 9:57 AM

## 2021-11-16 NOTE — Assessment & Plan Note (Signed)
Not controlled.  We discussed treatment options.  He has had genetic testing done in the past which show that he would respond well to most SSRIs with exception of Luvox, Lexapro, and Celexa.  We will try Zoloft.  He has been also past but does not member how he did with this.  Start 50 mg daily for 1 to 2 weeks and then increase to 100 mg daily.  We discussed potential side effects.  He will follow-up in a few weeks.

## 2021-11-16 NOTE — Addendum Note (Signed)
Addended by: Dyann Kief on: 11/16/2021 10:29 AM   Modules accepted: Orders

## 2021-11-16 NOTE — Patient Instructions (Signed)
It was very nice to see you today!  We will start Zoloft.  Please take 50 mg daily for 1 to 2 weeks and then increase to 100 mg daily.  Please send a message to let me know how this is working for you.  We will give your tetanus shot today.  I will see back in a few weeks for your physical.  Please come back to see me sooner if needed.  Take care, Dr Jimmey Ralph  PLEASE NOTE:  If you had any lab tests please let us know if you have not heard back within a few days. You may see your results on mychart before we have a chance to review them but we will give you a call once they are reviewed by [place]. If we ordered any referrals today, please let us know if you have not heard from their office within the next week.   Please try these tips to maintain a healthy lifestyle:  Eat at least 3 REAL meals and 1-2 snacks per day.  Aim for no more than 5 hours between eating.  If you eat breakfast, please do so within one hour of getting up.   Each meal should contain half fruits/vegetables, one quarter protein, and one quarter carbs (no bigger than a computer mouse)  Cut down on sweet beverages. This includes juice, soda, and sweet tea.   Drink at least 1 glass of water with each meal and aim for at least 8 glasses per day  Exercise at least 150 minutes every week.

## 2021-12-14 IMAGING — US US SCROTUM W/ DOPPLER COMPLETE
1 series · 14 of 25 positions shown · non-contrast
Comparison: Scrotal ultrasound 01/27/2018

CLINICAL DATA: Left testicular pain after picking up log, history
of left varicocele

EXAM:
SCROTAL ULTRASOUND
DOPPLER ULTRASOUND OF THE TESTICLES
TECHNIQUE: Complete ultrasound examination of the testicles, epididymis, and
other scrotal structures was performed. Color and spectral Doppler
ultrasound were also utilized to evaluate blood flow to the
testicles.

[Series 1: us scrotum w/ doppler complete · 14 of 40 slices shown]
[im 1/40]
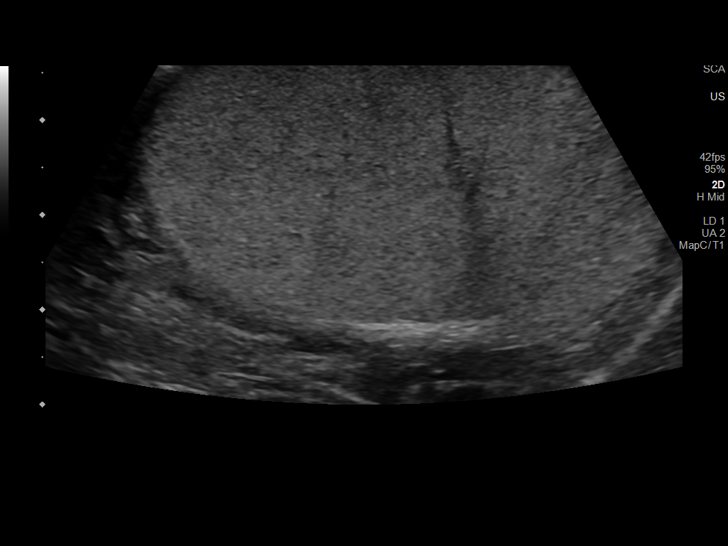
[im 4/40]
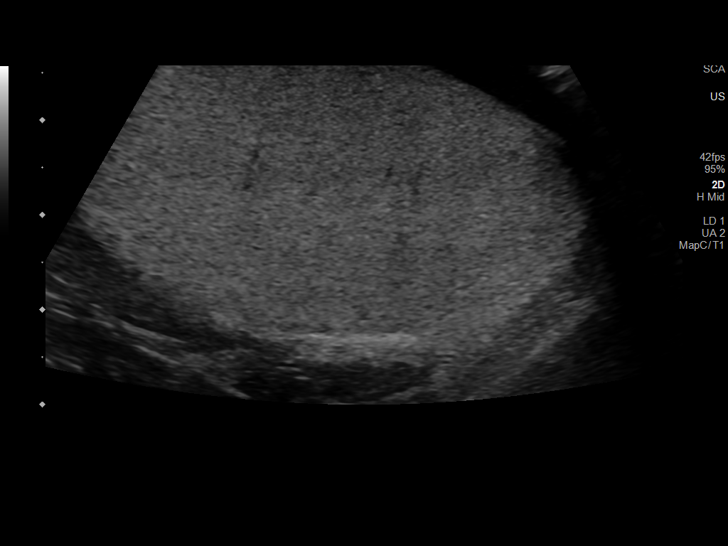
[im 7/40]
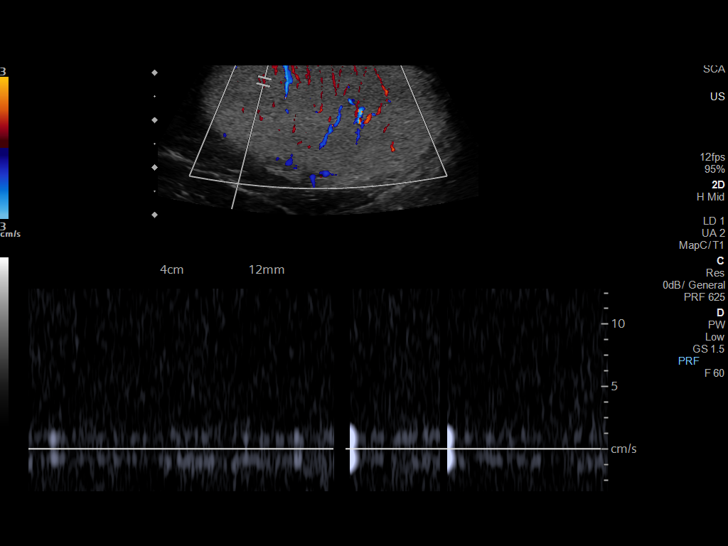
[im 10/40]
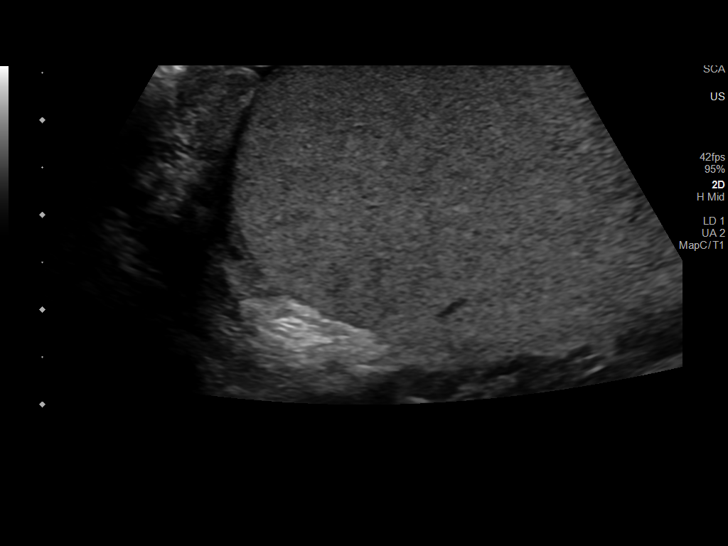
[im 14/40]
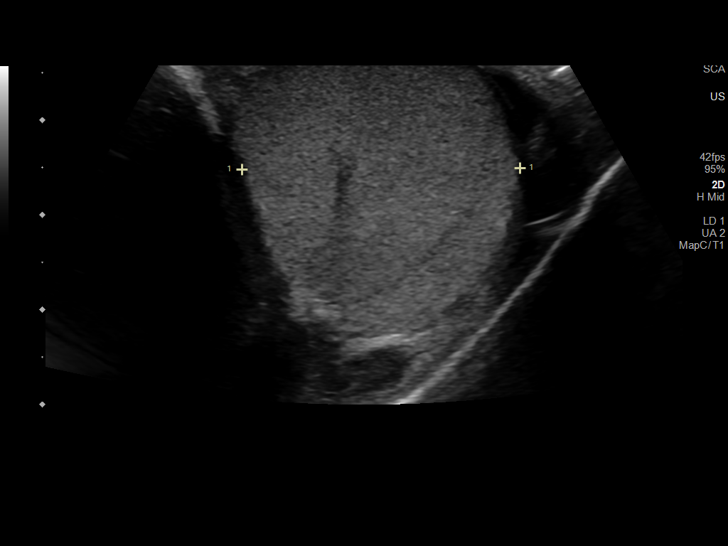
[im 15/40]
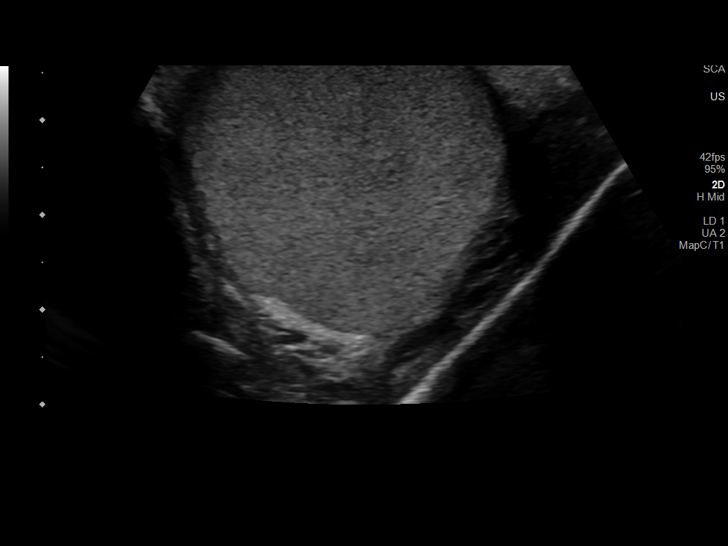
[im 18/40]
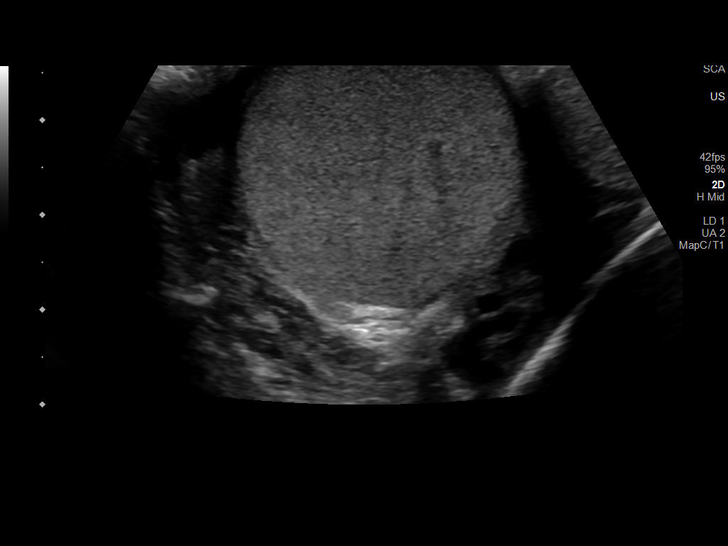
[im 22/40]
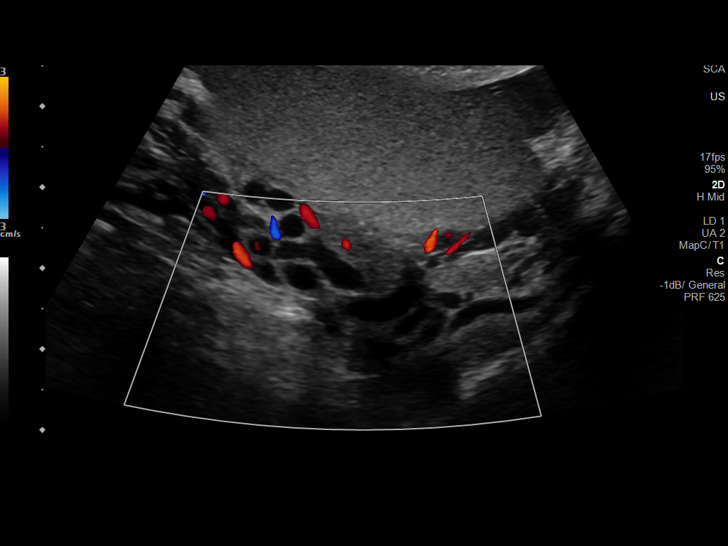
[im 25/40]
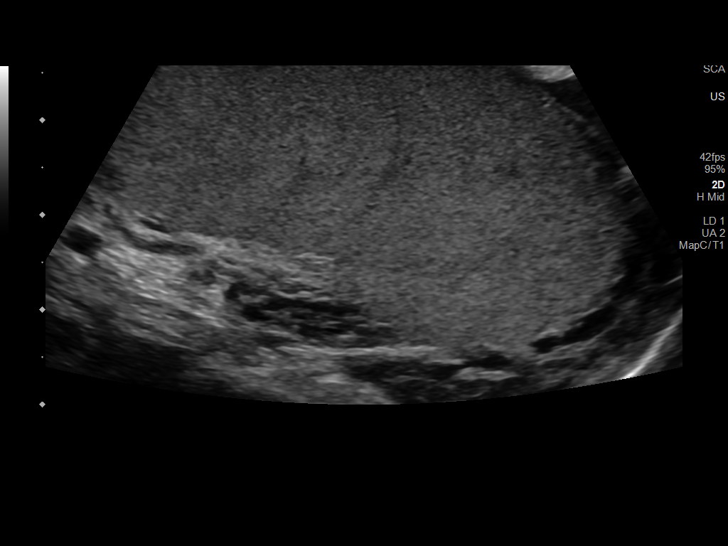
[im 27/40]
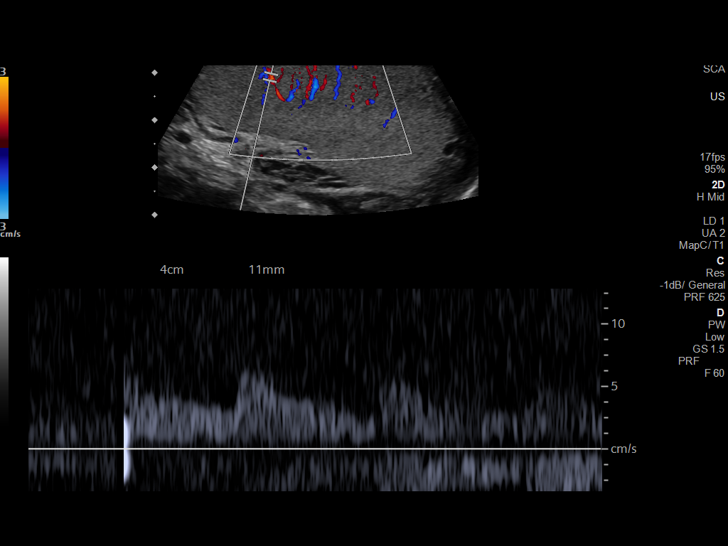
[im 30/40]
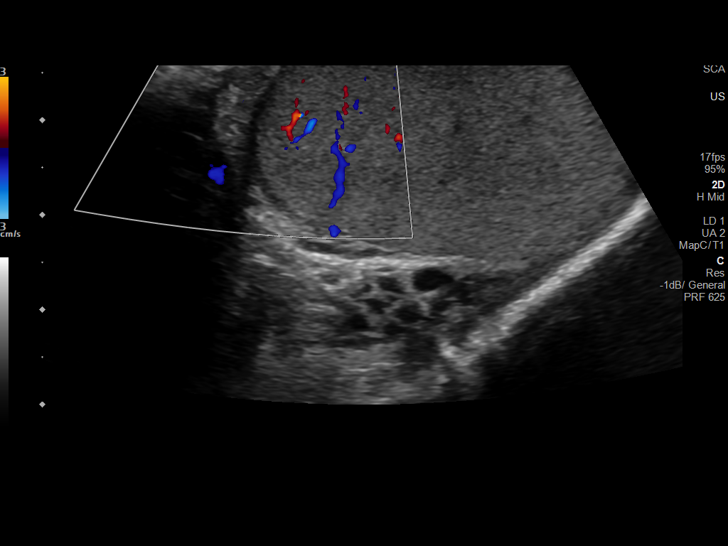
[im 33/40]
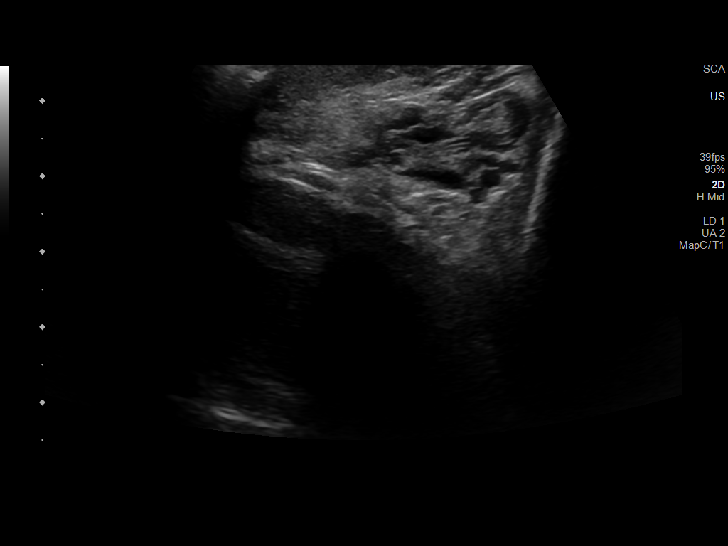
[im 36/40]
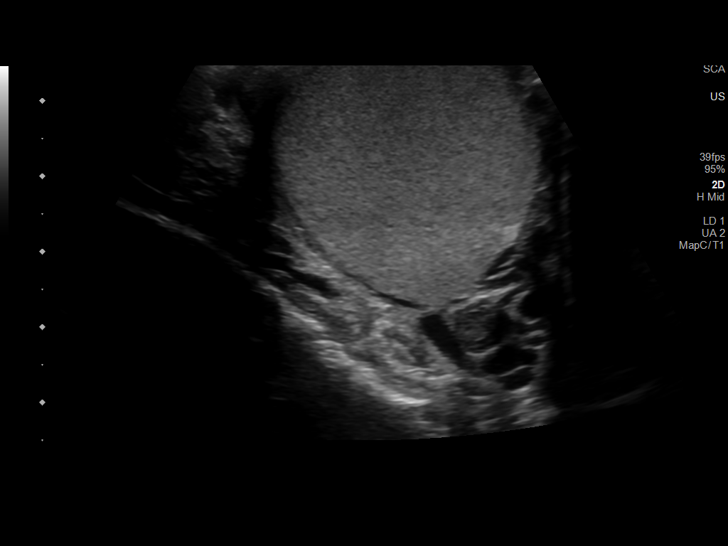
[im 40/40]
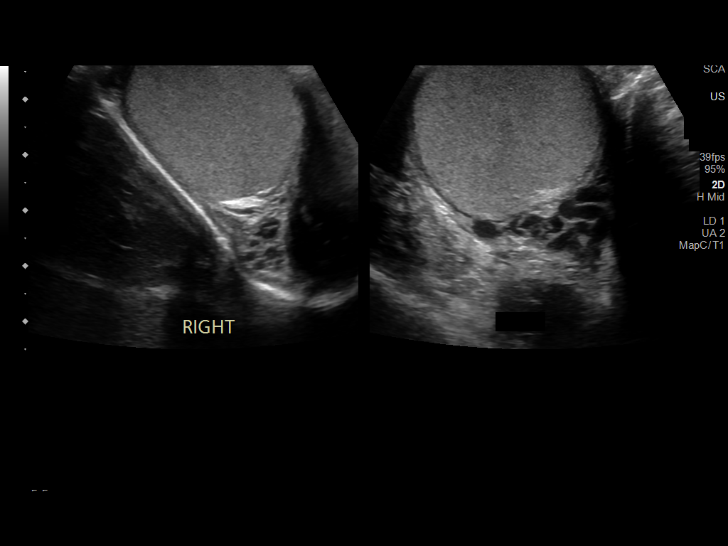

[14 of 25 positions shown; findings below may reference images not displayed]

FINDINGS: Right testicle

Measurements: 6 x 3.1 x 2.9 cm. No mass or microlithiasis
visualized.

Left testicle

Measurements: 5.8 x 3.3 x 3.7 cm. No mass or microlithiasis
visualized.

Right epididymis:  Normal in size and appearance.

Left epididymis:  Normal in size and appearance.

Hydrocele: Slightly asymmetric left paratesticular fluid may reflect
a trace left hydrocele.

Varicocele:  Redemonstration of a left varicocele.

Pulsed Doppler interrogation of both testes demonstrates normal low
resistance arterial and venous waveforms bilaterally.
IMPRESSION: No evidence of testicular mass or torsion.

Suspect a trace left hydrocele as well as a redemonstrated isolated
left varicocele.

## 2022-01-03 ENCOUNTER — Encounter: Payer: 59 | Admitting: Family Medicine

## 2022-01-22 ENCOUNTER — Encounter: Payer: Self-pay | Admitting: Family Medicine

## 2022-01-22 ENCOUNTER — Ambulatory Visit: Payer: 59 | Admitting: Family Medicine

## 2022-01-22 VITALS — BP 130/82 | HR 69 | Temp 98.8°F | Ht 75.0 in | Wt 158.0 lb

## 2022-01-22 DIAGNOSIS — L237 Allergic contact dermatitis due to plants, except food: Secondary | ICD-10-CM | POA: Diagnosis not present

## 2022-01-22 MED ORDER — PREDNISONE 20 MG PO TABS: 20.0000 mg | ORAL_TABLET | Freq: Every day | ORAL | 0 refills | Status: DC

## 2022-01-22 MED ORDER — HYDROXYZINE HCL 50 MG PO TABS: 50.0000 mg | ORAL_TABLET | Freq: Three times a day (TID) | ORAL | 0 refills | Status: DC | PRN

## 2022-01-22 MED ORDER — METHYLPREDNISOLONE ACETATE 80 MG/ML IJ SUSP
80.0000 mg | Freq: Once | INTRAMUSCULAR | Status: AC
  Administered 2022-01-22: 80 mg via INTRAMUSCULAR

## 2022-01-22 NOTE — Patient Instructions (Signed)
It was very nice to see you today!  We will give you a steroid injection today.  Please start the prednisone today.  Take 1 pill every day for the next 2 weeks.  Use the hydroxyzine as needed for itching.   Let me know if not improving.  Take care, Dr Jerline Pain  PLEASE NOTE:  If you had any lab tests please let us know if you have not heard back within a few days. You may see your results on mychart before we have a chance to review them but we will give you a call once they are reviewed by [place]. If we ordered any referrals today, please let us know if you have not heard from their office within the next week.   Please try these tips to maintain a healthy lifestyle:  Eat at least 3 REAL meals and 1-2 snacks per day.  Aim for no more than 5 hours between eating.  If you eat breakfast, please do so within one hour of getting up.   Each meal should contain half fruits/vegetables, one quarter protein, and one quarter carbs (no bigger than a computer mouse)  Cut down on sweet beverages. This includes juice, soda, and sweet tea.   Drink at least 1 glass of water with each meal and aim for at least 8 glasses per day  Exercise at least 150 minutes every week.

## 2022-01-22 NOTE — Progress Notes (Signed)
   Mark Monroe is a 21 y.o. male who presents today for an office visit.  Assessment/Plan:  Contact dermatitis No red flags.  Discussed with patient he needs to be on longer course of steroids for contact dermatitis.  We will send in prednisone 20 mg daily for the next 2 weeks.  We will also give 80 mg of Depo-Medrol today.  We will also send prescription for hydroxyzine to use as needed for itching.  He can continue using the topical triamcinolone he has at home.  We discussed reasons to return to care.  Follow-up as needed.    Subjective:  HPI:  Patient here for rash.  Started 5 days ago.  Consistent with prior poison ivy exposures.  He went to urgent care the next day.  Started on Depo-Medrol and was given a steroid shot.  Symptoms improved for about a day but have worsened again over the last several days.  No reported wheezing or shortness of breath.       Objective:  Physical Exam: BP 130/82   Pulse 69   Temp 98.8 F (37.1 C) (Temporal)   Ht 6\' 3"  (1.905 m)   Wt 158 lb (71.7 kg)   SpO2 97%   BMI 19.75 kg/m   Gen: No acute distress, resting comfortably Skin: Erythematous rash on upper extremities and face Neuro: Grossly normal, moves all extremities Psych: Normal affect and thought content      Tiffanee Mcnee M. Jerline Pain, MD 01/22/2022 2:53 PM

## 2022-02-13 ENCOUNTER — Other Ambulatory Visit: Payer: Self-pay | Admitting: Family Medicine

## 2022-07-18 ENCOUNTER — Ambulatory Visit
Admission: EM | Admit: 2022-07-18 | Discharge: 2022-07-18 | Disposition: A | Discharge status: Home / Self Care | Payer: 59 | Attending: Nurse Practitioner | Admitting: Nurse Practitioner

## 2022-07-18 DIAGNOSIS — R21 Rash and other nonspecific skin eruption: Secondary | ICD-10-CM | POA: Diagnosis not present

## 2022-07-18 MED ORDER — HYDROXYZINE HCL 50 MG PO TABS: 50.0000 mg | ORAL_TABLET | Freq: Three times a day (TID) | ORAL | 0 refills | Status: AC | PRN

## 2022-07-18 MED ORDER — PREDNISONE 10 MG PO TABS: ORAL_TABLET | ORAL | 0 refills | Status: AC

## 2022-07-18 MED ORDER — METHYLPREDNISOLONE SODIUM SUCC 125 MG IJ SOLR
60.0000 mg | Freq: Once | INTRAMUSCULAR | Status: AC
  Administered 2022-07-18: 60 mg via INTRAMUSCULAR

## 2022-07-18 NOTE — ED Triage Notes (Signed)
Pt reports he has came in contact with poison oak x 1 day. His eyes are swollen, his thighs, arms, eyebrows, and chin area are red patchy.

## 2022-07-18 NOTE — ED Provider Notes (Signed)
[facility]    CSN: [id number] Arrival date & time: 07/18/22  1321      History   Chief Complaint No chief complaint on file.   HPI Mark Monroe is a 22 y.o. male.   Patient presents today for itchy rash to his face, both eyelids, bilateral arms, and bilateral lower extremities.  Reports the areas are slightly red.  No oozing, blisters, or pain.  No fevers or nausea/vomiting.  No shortness of breath, throat or tongue swelling, or new muscle pain/joint aches.  No recent change in detergents, soaps, personal care products.  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  Has not taken anything for symptoms so far.  Reports yesterday, he was clearing brush and try to avoid poison oak, however he may have gotten it on him.    Past Medical History:  Diagnosis Date   Abdominal pain    ADHD (attention deficit hyperactivity disorder)    Eval by Dr. 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 Everitt Amber   NASOLACRIMAL DUCT PROBING W/ INSERTION OF STENT  02/2000   Surgery at [facility] after recurrence after probing  alone in 09/1999       Home Medications    Prior to Admission medications   Medication Sig Start Date End Date Taking? Authorizing Provider  predniSONE (DELTASONE) 10 MG tablet Take 6 tablets by mouth daily for 2 days, then reduce by 1 tablet every 2 days until gone 07/18/22  Yes Noemi Chapel A, NP  EPINEPHrine (EPIPEN 2-PAK) 0.3 mg/0.3 mL IJ SOAJ injection Inject 0.3 mLs  (0.3 mg total) into the muscle once as needed (for severe allergic reaction). CAll 911asap if you have to use this medicine 01/22/18   Leamon Arnt, MD  hydrOXYzine (ATARAX) 50 MG tablet Take 1 tablet (50 mg total) by mouth 3 (three) times daily as needed. Do not take with alcohol or while driving or operating heavy machinery.  May cause drowsiness. 07/18/22   Eulogio Bear, NP  GuanFACINE HCl 3 MG TB24 Take 1 tablet (3 mg total) by mouth daily. 05/03/11 07/18/11  Merrilee Jansky, MD    Family History Family History  Problem Relation Age of Onset   Cancer Paternal Grandfather    Heart disease Paternal Grandfather    Hyperlipidemia Paternal Grandfather    Irritable bowel syndrome Mother    Anxiety disorder Mother    Depression Mother    Cholecystitis Father    Hypertension Father    Hyperlipidemia Father    Depression Maternal Grandmother    Hypertension Maternal Grandmother    Heart disease Paternal Grandmother    Hyperlipidemia Paternal Grandmother    Cancer Paternal Grandmother    Hereditary spherocytosis Cousin    ADD / ADHD Cousin    Heart disease Maternal Grandfather    Hyperlipidemia Maternal Grandfather    Crohn's disease Neg Hx    Ulcerative colitis Neg Hx    Celiac disease Neg  Hx    Ulcers Neg Hx     Social History Social History   Tobacco Use   Smoking status: Every Day    Types: Cigarettes, Cigars, E-cigarettes   Smokeless tobacco: Current    Types: Snuff  Vaping Use   Vaping Use: Every day  Substance Use Topics   Alcohol use: Yes    Comment: occ   Drug use: Yes    Types: Marijuana     Allergies   Wasp venom, Bee venom, and Poison ivy extract   Review of Systems Review of Systems Per HPI  Physical Exam Triage Vital Signs ED Triage Vitals  Enc Vitals Group     BP 07/18/22 1327 126/76     Pulse Rate 07/18/22 1327 84     Resp 07/18/22 1327 20     Temp 07/18/22 1327 97.9 F (36.6 C)     Temp Source 07/18/22 1327 Oral     SpO2 07/18/22  1327 98 %     Weight --      Height --      Head Circumference --      Peak Flow --      Pain Score 07/18/22 1328 6     Pain Loc --      Pain Edu? --      Excl. in [place]? --    No data found.  Updated Vital Signs BP 126/76 (BP Location: Right Arm)   Pulse 84   Temp 97.9 F (36.6 C) (Oral)   Resp 20   SpO2 98%   Visual Acuity Right Eye Distance:   Left Eye Distance:   Bilateral Distance:    Right Eye Near:   Left Eye Near:    Bilateral Near:     Physical Exam Vitals and nursing note reviewed.  Constitutional:      General: He is not in acute distress.    Appearance: Normal appearance. He is not toxic-appearing.  HENT:     Head: Normocephalic and atraumatic.     Mouth/Throat:     Mouth: Mucous membranes are moist.     Pharynx: Oropharynx is clear. No oropharyngeal exudate or posterior oropharyngeal erythema.  Eyes:     General: No scleral icterus.    Extraocular Movements: Extraocular movements intact.  Pulmonary:     Effort: Pulmonary effort is normal. No respiratory distress.  Musculoskeletal:     Cervical back: Normal range of motion.  Lymphadenopathy:     Cervical: No cervical adenopathy.  Skin:    General: Skin is warm and dry.     Capillary Refill: Capillary refill takes less than 2 seconds.     Findings: Erythema and rash present. Rash is papular.     Comments: Widespread, multiple, erythematous papular rash to bilateral upper extremities, face.  No central clearing, surrounding fluctuance, active drainage, or odor.  Neurological:     Mental Status: He is alert and oriented to person, place, and time.  Psychiatric:        Behavior: Behavior is cooperative.      UC Treatments / Results  Labs (all labs ordered are listed, but only abnormal results are displayed) Labs Reviewed - No data to display  EKG   Radiology No results found.  Procedures Procedures (including critical care time)  Medications Ordered in UC Medications   methylPREDNISolone sodium succinate (SOLU-MEDROL) 125 mg/2 mL injection 60 mg (60 mg Intramuscular Given 07/18/22 1344)    Initial Impression / Assessment and Plan / UC Course  I have reviewed the triage vital signs and the nursing notes.  Pertinent labs & imaging results that were available during my care of the patient were reviewed by me and considered in my medical decision making (see chart for details).   Patient is well-appearing, normotensive, afebrile, not tachycardic, not tachypneic, oxygenating well on room air.    1. Rash and nonspecific skin eruption Suspect contact dermatitis secondary to plant oil Solu-Medrol 60 mg IM given today in urgent care for inflammation Start oral prednisone tomorrow Can also start hydroxyzine every 8 hours as needed for itching ER and return precautions discussed with patient  The patient was given the opportunity to ask questions.  All questions answered to their satisfaction.  The patient is in agreement to this plan.    Final Clinical Impressions(s) / UC Diagnoses   Final diagnoses:  Rash and nonspecific skin eruption     Discharge Instructions      Take the prednisone as prescribed for the rash; we have given you a shot of steroid today and star the oral prednisone tomorrow morning.  You can take the anti-itch medication every 8 hours as needed for itching.      ED Prescriptions     Medication Sig Dispense Auth. Provider   predniSONE (DELTASONE) 10 MG tablet Take 6 tablets by mouth daily for 2 days, then reduce by 1 tablet every 2 days until gone 42 tablet Noemi Chapel A, NP   hydrOXYzine (ATARAX) 50 MG tablet Take 1 tablet (50 mg total) by mouth 3 (three) times daily as needed. Do not take with alcohol or while driving or operating heavy machinery.  May cause drowsiness. 30 tablet Eulogio Bear, NP      PDMP not reviewed this encounter.   Eulogio Bear, NP 07/18/22 1410

## 2022-07-18 NOTE — Discharge Instructions (Addendum)
Take the prednisone as prescribed for the rash; we have given you a shot of steroid today and star the oral prednisone tomorrow morning.  You can take the anti-itch medication every 8 hours as needed for itching.
